# Patient Record
Sex: Female | Born: 1965
Health system: Southern US, Community
[De-identification: ages and names within clinical notes are randomized; demographics above are authoritative.]

## PROBLEM LIST (undated history)

## (undated) DIAGNOSIS — E559 Vitamin D deficiency, unspecified: Secondary | ICD-10-CM

## (undated) DIAGNOSIS — B182 Chronic viral hepatitis C: Secondary | ICD-10-CM

## (undated) DIAGNOSIS — F319 Bipolar disorder, unspecified: Secondary | ICD-10-CM

## (undated) DIAGNOSIS — K819 Cholecystitis, unspecified: Secondary | ICD-10-CM

## (undated) DIAGNOSIS — F1910 Other psychoactive substance abuse, uncomplicated: Secondary | ICD-10-CM

## (undated) DIAGNOSIS — R202 Paresthesia of skin: Secondary | ICD-10-CM

## (undated) DIAGNOSIS — E669 Obesity, unspecified: Secondary | ICD-10-CM

## (undated) DIAGNOSIS — R51 Headache: Secondary | ICD-10-CM

## (undated) DIAGNOSIS — E785 Hyperlipidemia, unspecified: Secondary | ICD-10-CM

## (undated) HISTORY — DX: Vitamin D deficiency, unspecified: E55.9

## (undated) HISTORY — PX: EYE SURGERY: SHX253

## (undated) HISTORY — DX: Headache: R51

## (undated) HISTORY — DX: Obesity, unspecified: E66.9

## (undated) HISTORY — DX: Hyperlipidemia, unspecified: E78.5

## (undated) HISTORY — PX: APPENDECTOMY: SHX54

## (undated) HISTORY — DX: Other psychoactive substance abuse, uncomplicated: F19.10

## (undated) HISTORY — DX: Paresthesia of skin: R20.2

---

## 2009-02-27 ENCOUNTER — Emergency Department (HOSPITAL_COMMUNITY): Admission: EM | Admit: 2009-02-27 | Discharge: 2009-02-27 | Payer: Self-pay | Admitting: Emergency Medicine

## 2009-05-28 ENCOUNTER — Emergency Department (HOSPITAL_COMMUNITY): Admission: EM | Admit: 2009-05-28 | Discharge: 2009-05-29 | Payer: Self-pay | Admitting: Emergency Medicine

## 2009-09-18 ENCOUNTER — Emergency Department (HOSPITAL_COMMUNITY): Admission: EM | Admit: 2009-09-18 | Discharge: 2009-09-18 | Payer: Self-pay | Admitting: Emergency Medicine

## 2010-05-08 ENCOUNTER — Encounter (INDEPENDENT_AMBULATORY_CARE_PROVIDER_SITE_OTHER): Payer: Self-pay | Admitting: *Deleted

## 2010-05-08 ENCOUNTER — Ambulatory Visit: Payer: Self-pay | Admitting: Internal Medicine

## 2010-05-08 LAB — CONVERTED CEMR LAB
ALT: 24 units/L (ref 0–35)
Albumin: 3.9 g/dL (ref 3.5–5.2)
Basophils Absolute: 0 10*3/uL (ref 0.0–0.1)
Basophils Relative: 0 % (ref 0–1)
Calcium: 9.2 mg/dL (ref 8.4–10.5)
Chlamydia, Swab/Urine, PCR: NEGATIVE
Chloride: 106 meq/L (ref 96–112)
Creatinine, Ser: 0.59 mg/dL (ref 0.40–1.20)
Eosinophils Absolute: 0.1 10*3/uL (ref 0.0–0.7)
HCT: 40 % (ref 36.0–46.0)
Hemoglobin: 13 g/dL (ref 12.0–15.0)
Hep A IgM: NEGATIVE
Hep B C IgM: NEGATIVE
Hepatitis B Surface Ag: NEGATIVE
MCV: 90.7 fL (ref 78.0–100.0)
Monocytes Absolute: 0.7 10*3/uL (ref 0.1–1.0)
Neutrophils Relative %: 59 % (ref 43–77)
Platelets: 286 10*3/uL (ref 150–400)
Potassium: 4.5 meq/L (ref 3.5–5.3)

## 2010-05-17 ENCOUNTER — Encounter (INDEPENDENT_AMBULATORY_CARE_PROVIDER_SITE_OTHER): Payer: Self-pay | Admitting: *Deleted

## 2010-10-17 LAB — URINALYSIS, ROUTINE W REFLEX MICROSCOPIC
Bilirubin Urine: NEGATIVE
Ketones, ur: NEGATIVE mg/dL
Nitrite: NEGATIVE
Specific Gravity, Urine: 1.02 (ref 1.005–1.030)
pH: 7 (ref 5.0–8.0)

## 2010-10-17 LAB — URINE MICROSCOPIC-ADD ON

## 2010-12-09 ENCOUNTER — Emergency Department (HOSPITAL_COMMUNITY)
Admission: EM | Admit: 2010-12-09 | Discharge: 2010-12-09 | Disposition: A | Discharge status: Home / Self Care | Payer: Medicare Other | Attending: Emergency Medicine | Admitting: Emergency Medicine

## 2010-12-09 DIAGNOSIS — J40 Bronchitis, not specified as acute or chronic: Secondary | ICD-10-CM | POA: Insufficient documentation

## 2010-12-09 DIAGNOSIS — F17200 Nicotine dependence, unspecified, uncomplicated: Secondary | ICD-10-CM | POA: Insufficient documentation

## 2010-12-09 DIAGNOSIS — R059 Cough, unspecified: Secondary | ICD-10-CM | POA: Insufficient documentation

## 2010-12-09 DIAGNOSIS — R05 Cough: Secondary | ICD-10-CM | POA: Insufficient documentation

## 2010-12-13 ENCOUNTER — Emergency Department (HOSPITAL_COMMUNITY): Payer: Medicare Other

## 2010-12-13 ENCOUNTER — Observation Stay (HOSPITAL_COMMUNITY)
Admission: EM | Admit: 2010-12-13 | Discharge: 2010-12-14 | Disposition: A | Discharge status: Home / Self Care | Payer: Medicare Other | Source: Ambulatory Visit | Attending: Emergency Medicine | Admitting: Emergency Medicine

## 2010-12-13 DIAGNOSIS — R0609 Other forms of dyspnea: Secondary | ICD-10-CM | POA: Insufficient documentation

## 2010-12-13 DIAGNOSIS — F17200 Nicotine dependence, unspecified, uncomplicated: Principal | ICD-10-CM | POA: Insufficient documentation

## 2010-12-13 DIAGNOSIS — R0989 Other specified symptoms and signs involving the circulatory and respiratory systems: Secondary | ICD-10-CM | POA: Insufficient documentation

## 2011-04-27 ENCOUNTER — Emergency Department (HOSPITAL_COMMUNITY)
Admission: EM | Admit: 2011-04-27 | Discharge: 2011-04-28 | Disposition: A | Discharge status: Home / Self Care | Payer: Medicare Other | Attending: Emergency Medicine | Admitting: Emergency Medicine

## 2011-04-27 ENCOUNTER — Emergency Department (HOSPITAL_COMMUNITY): Payer: Medicare Other

## 2011-04-27 DIAGNOSIS — R05 Cough: Secondary | ICD-10-CM | POA: Insufficient documentation

## 2011-04-27 DIAGNOSIS — M79609 Pain in unspecified limb: Secondary | ICD-10-CM | POA: Insufficient documentation

## 2011-04-27 DIAGNOSIS — M25569 Pain in unspecified knee: Secondary | ICD-10-CM | POA: Insufficient documentation

## 2011-04-27 DIAGNOSIS — J069 Acute upper respiratory infection, unspecified: Secondary | ICD-10-CM | POA: Insufficient documentation

## 2011-04-27 DIAGNOSIS — M25559 Pain in unspecified hip: Secondary | ICD-10-CM | POA: Insufficient documentation

## 2011-04-27 DIAGNOSIS — G8929 Other chronic pain: Secondary | ICD-10-CM | POA: Insufficient documentation

## 2011-04-27 DIAGNOSIS — R0789 Other chest pain: Secondary | ICD-10-CM | POA: Insufficient documentation

## 2011-04-27 DIAGNOSIS — R059 Cough, unspecified: Secondary | ICD-10-CM | POA: Insufficient documentation

## 2011-04-27 LAB — POCT I-STAT, CHEM 8
Chloride: 110 mEq/L (ref 96–112)
Potassium: 3.8 mEq/L (ref 3.5–5.1)
Sodium: 143 mEq/L (ref 135–145)

## 2011-04-27 LAB — DIFFERENTIAL
Basophils Absolute: 0 10*3/uL (ref 0.0–0.1)
Basophils Relative: 0 % (ref 0–1)
Lymphocytes Relative: 30 % (ref 12–46)
Lymphs Abs: 2.8 10*3/uL (ref 0.7–4.0)
Monocytes Relative: 10 % (ref 3–12)
Neutrophils Relative %: 58 % (ref 43–77)

## 2011-04-27 LAB — CBC
MCH: 29.5 pg (ref 26.0–34.0)
MCHC: 33.7 g/dL (ref 30.0–36.0)
MCV: 87.6 fL (ref 78.0–100.0)
Platelets: 271 10*3/uL (ref 150–400)
RBC: 3.96 MIL/uL (ref 3.87–5.11)
WBC: 9.3 10*3/uL (ref 4.0–10.5)

## 2011-04-27 LAB — POCT I-STAT TROPONIN I: Troponin i, poc: 0 ng/mL (ref 0.00–0.08)

## 2011-04-27 LAB — D-DIMER, QUANTITATIVE: D-Dimer, Quant: 0.52 ug/mL-FEU — ABNORMAL HIGH (ref 0.00–0.48)

## 2011-04-28 ENCOUNTER — Ambulatory Visit (HOSPITAL_COMMUNITY)
Admission: EM | Admit: 2011-04-28 | Discharge: 2011-04-28 | Disposition: A | Discharge status: Home / Self Care | Payer: Medicare Other | Source: Ambulatory Visit | Attending: Emergency Medicine | Admitting: Emergency Medicine

## 2011-04-28 ENCOUNTER — Ambulatory Visit (HOSPITAL_COMMUNITY): Admission: EM | Admit: 2011-04-28 | Payer: Medicare Other | Source: Ambulatory Visit | Admitting: Emergency Medicine

## 2011-04-28 DIAGNOSIS — M79609 Pain in unspecified limb: Secondary | ICD-10-CM

## 2011-04-28 DIAGNOSIS — R209 Unspecified disturbances of skin sensation: Secondary | ICD-10-CM | POA: Insufficient documentation

## 2011-07-11 ENCOUNTER — Encounter: Payer: Self-pay | Admitting: Emergency Medicine

## 2011-07-11 ENCOUNTER — Emergency Department (HOSPITAL_COMMUNITY)
Admission: EM | Admit: 2011-07-11 | Discharge: 2011-07-11 | Disposition: A | Discharge status: Home / Self Care | Payer: Medicare Other | Attending: Emergency Medicine | Admitting: Emergency Medicine

## 2011-07-11 DIAGNOSIS — F17200 Nicotine dependence, unspecified, uncomplicated: Secondary | ICD-10-CM | POA: Insufficient documentation

## 2011-07-11 DIAGNOSIS — Z8619 Personal history of other infectious and parasitic diseases: Secondary | ICD-10-CM | POA: Insufficient documentation

## 2011-07-11 DIAGNOSIS — M545 Low back pain, unspecified: Secondary | ICD-10-CM | POA: Insufficient documentation

## 2011-07-11 DIAGNOSIS — N390 Urinary tract infection, site not specified: Secondary | ICD-10-CM

## 2011-07-11 HISTORY — DX: Chronic viral hepatitis C: B18.2

## 2011-07-11 LAB — URINALYSIS, ROUTINE W REFLEX MICROSCOPIC
Glucose, UA: NEGATIVE mg/dL
Hgb urine dipstick: NEGATIVE
Nitrite: NEGATIVE
Specific Gravity, Urine: 1.028 (ref 1.005–1.030)
pH: 5 (ref 5.0–8.0)

## 2011-07-11 LAB — URINE MICROSCOPIC-ADD ON

## 2011-07-11 LAB — PREGNANCY, URINE: Preg Test, Ur: NEGATIVE

## 2011-07-11 MED ORDER — IBUPROFEN 800 MG PO TABS
800.0000 mg | ORAL_TABLET | Freq: Once | ORAL | Status: AC
  Administered 2011-07-11: 800 mg via ORAL
  Filled 2011-07-11: qty 1

## 2011-07-11 MED ORDER — CYCLOBENZAPRINE HCL 10 MG PO TABS
10.0000 mg | ORAL_TABLET | Freq: Once | ORAL | Status: AC
  Administered 2011-07-11: 10 mg via ORAL
  Filled 2011-07-11: qty 1

## 2011-07-11 MED ORDER — OXYCODONE-ACETAMINOPHEN 5-325 MG PO TABS
1.0000 | ORAL_TABLET | Freq: Once | ORAL | Status: AC
  Administered 2011-07-11: 1 via ORAL
  Filled 2011-07-11: qty 1

## 2011-07-11 MED ORDER — NITROFURANTOIN MONOHYD MACRO 100 MG PO CAPS: 100.0000 mg | ORAL_CAPSULE | Freq: Two times a day (BID) | ORAL | Status: AC

## 2011-07-11 MED ORDER — ORPHENADRINE CITRATE ER 100 MG PO TB12: 100.0000 mg | ORAL_TABLET | Freq: Two times a day (BID) | ORAL | Status: AC

## 2011-07-11 MED ORDER — NAPROXEN 500 MG PO TABS: 500.0000 mg | ORAL_TABLET | Freq: Two times a day (BID) | ORAL | Status: DC

## 2011-07-11 MED ORDER — OXYCODONE-ACETAMINOPHEN 5-325 MG PO TABS: 1.0000 | ORAL_TABLET | ORAL | Status: AC | PRN

## 2011-07-11 NOTE — ED Notes (Signed)
Pt presents with a c/c of severe mid-lower back since 2200 last night, st's she has chronic back pain but this is much worse than normal, st's "it's hard to breathe."  St's she took Trazodone and xanax  but that didn't help.  St's she drinks a lot of soda, never had kidney stones.

## 2011-07-11 NOTE — ED Provider Notes (Signed)
History     CSN: [id number]  Arrival date & time 07/11/11  0459   First MD Initiated Contact with Patient 07/11/11 0536      Chief Complaint  Patient presents with  . Back Pain    (Consider location/radiation/quality/duration/timing/severity/associated sxs/prior treatment) HPI 45 year old female complains of low back pain which started at 11 PM. Pain is constant and does not radiate. It is described as sharp and severe and she rates the pain at 10 out of 10. Nothing seems to make it better and nothing seems to make it worse. She has not taken any medication for it. She did apply a back brace which did give some slight relief. She denies any weakness, numbness, tingling. She has noted that her urine is darker than normal and states that she has not had a menses for the last 2 months. She did take a home pregnancy test yesterday which was negative. She denies urinary urgency, frequency, tenesmus. Past Medical History  Diagnosis Date  . Hep C w/ coma, chronic     History reviewed. No pertinent past surgical history.  No family history on file.  History  Substance Use Topics  . Smoking status: Current Everyday Smoker -- 1.0 packs/day    Types: Cigarettes  . Smokeless tobacco: Not on file  . Alcohol Use:     OB History    Grav Para Term Preterm Abortions TAB SAB Ect Mult Living                  Review of Systems  Allergies  Review of patient's allergies indicates no known allergies.  Home Medications   Current Outpatient Rx  Name Route Sig Dispense Refill  . ALPRAZOLAM 0.5 MG PO TABS Oral Take 0.5 mg by mouth at bedtime as needed.      Marland Kitchen LAMOTRIGINE 200 MG PO TABS Oral Take 200 mg by mouth daily.     . TRAZODONE HCL 100 MG PO TABS Oral Take 100 mg by mouth at bedtime.        BP 117/54  Pulse 78  Temp(Src) 98.3 F (36.8 C) (Oral)  Resp 18  Ht 5\' 1"  (1.549 m)  Wt 220 lb (99.791 kg)  BMI 41.57 kg/m2  SpO2 98%  LMP 06/17/2011  Physical Exam 45 year old female  who is resting comfortably and in no acute distress. She is obese. Vital signs are normal. Oxygen saturation is 98% which is normal. Head is normocephalic and atraumatic. PERRLA, EOMI. Oropharynx is clear. Neck is supple without adenopathy or JVD. Lungs are clear without rales, wheezes, rhonchi. Back has moderate tenderness in the lumbar spine with moderate left paralumbar spasm and mild right paralumbar spasm. Straight leg raise is positive bilaterally at 30. Heart has regular rate and rhythm without murmur. Abdomen is soft, flat, nontender without masses or hepatosplenomegaly. Extremities have 1+ edema, no cyanosis. Full range of motion is present. Skin is warm and moist without rash. Neurologic: Mental status is normal, cranial nerves are intact, there no focal motor or sensory deficits. Detailed motor and sensory examination was done of the legs and was completely normal. ED Course  Procedures (including critical care time)   Labs Reviewed  URINALYSIS, ROUTINE W REFLEX MICROSCOPIC  PREGNANCY, URINE   No results found.  Results for orders placed during the hospital encounter of 07/11/11  URINALYSIS, ROUTINE W REFLEX MICROSCOPIC      Component Value Range   Color, Urine AMBER (*) YELLOW    APPearance CLOUDY (*) CLEAR  Specific Gravity, Urine 1.028  1.005 - 1.030    pH 5.0  5.0 - 8.0    Glucose, UA NEGATIVE  NEGATIVE (mg/dL)   Hgb urine dipstick NEGATIVE  NEGATIVE    Bilirubin Urine NEGATIVE  NEGATIVE    Ketones, ur NEGATIVE  NEGATIVE (mg/dL)   Protein, ur NEGATIVE  NEGATIVE (mg/dL)   Urobilinogen, UA 1.0  0.0 - 1.0 (mg/dL)   Nitrite NEGATIVE  NEGATIVE    Leukocytes, UA SMALL (*) NEGATIVE   PREGNANCY, URINE      Component Value Range   Preg Test, Ur NEGATIVE    URINE MICROSCOPIC-ADD ON      Component Value Range   Squamous Epithelial / LPF MANY (*) RARE    WBC, UA 21-50  <3 (WBC/hpf)   RBC / HPF 3-6  <3 (RBC/hpf)   Bacteria, UA MANY (*) RARE    Urine-Other MUCOUS PRESENT      No results found.   No diagnosis found.  She was given a dose of Percocet, Flexeril, and ibuprofen with excellent relief of pain. Urinalysis does show WBC clumps, but it is a contaminated specimen. I would treat her in. The for possible UTI, but I am not 100% certain that she actually has one. She will be sent home with prescriptions for Percocet, Norflex, naproxen, and Macrobid.  MDM  Musculoskeletal back pain with no evidence of radiculopathy.        Dione Booze, MD 07/11/11 [phone]

## 2011-07-11 NOTE — ED Notes (Signed)
Pt alert, nad, ambulates to triage, arrives via EMS, c/o low back pain, onset was a week ago, resp even unlabored, skin pwd, denies trauma or injury

## 2011-07-11 NOTE — ED Notes (Signed)
Patient verbalized understanding  

## 2011-07-24 ENCOUNTER — Encounter (HOSPITAL_COMMUNITY): Payer: Self-pay | Admitting: Emergency Medicine

## 2011-07-24 ENCOUNTER — Emergency Department (HOSPITAL_COMMUNITY): Payer: Medicare Other

## 2011-07-24 ENCOUNTER — Emergency Department (HOSPITAL_COMMUNITY)
Admission: EM | Admit: 2011-07-24 | Discharge: 2011-07-25 | Disposition: A | Discharge status: Home / Self Care | Payer: Medicare Other | Attending: Emergency Medicine | Admitting: Emergency Medicine

## 2011-07-24 DIAGNOSIS — Z8619 Personal history of other infectious and parasitic diseases: Secondary | ICD-10-CM | POA: Diagnosis not present

## 2011-07-24 DIAGNOSIS — L02519 Cutaneous abscess of unspecified hand: Secondary | ICD-10-CM | POA: Insufficient documentation

## 2011-07-24 DIAGNOSIS — Z79899 Other long term (current) drug therapy: Secondary | ICD-10-CM | POA: Diagnosis not present

## 2011-07-24 DIAGNOSIS — M79609 Pain in unspecified limb: Secondary | ICD-10-CM | POA: Diagnosis not present

## 2011-07-24 DIAGNOSIS — L02511 Cutaneous abscess of right hand: Secondary | ICD-10-CM

## 2011-07-24 DIAGNOSIS — F319 Bipolar disorder, unspecified: Secondary | ICD-10-CM | POA: Diagnosis not present

## 2011-07-24 DIAGNOSIS — L03019 Cellulitis of unspecified finger: Secondary | ICD-10-CM | POA: Insufficient documentation

## 2011-07-24 HISTORY — DX: Bipolar disorder, unspecified: F31.9

## 2011-07-24 LAB — POCT PREGNANCY, URINE: Preg Test, Ur: NEGATIVE

## 2011-07-24 MED ORDER — CLINDAMYCIN HCL 300 MG PO CAPS
300.0000 mg | ORAL_CAPSULE | Freq: Once | ORAL | Status: AC
  Administered 2011-07-24: 300 mg via ORAL
  Filled 2011-07-24 (×2): qty 1

## 2011-07-24 NOTE — ED Notes (Signed)
Pt c/o infection to nail bed to right middle finger, onset 4 days ago.  Pt also wants to talk to a Child psychotherapist for her Trazodone and another med.  St's she doesn't have the money to get her Rx filled

## 2011-07-24 NOTE — ED Provider Notes (Signed)
Pt seen with resident She has full ROM of finger, no tenderness along flexor sheath May be amenable to drainage and outpatient antibiotics  Joya Gaskins, MD 07/24/11 2242

## 2011-07-25 MED ORDER — CLINDAMYCIN HCL 150 MG PO CAPS: 300.0000 mg | ORAL_CAPSULE | Freq: Four times a day (QID) | ORAL | Status: AC

## 2011-07-25 NOTE — ED Notes (Signed)
Pt ambulated with steady gait; VSS; no signs of distress; A&Ox3; finger bandaged; no questions or concerns.

## 2011-07-25 NOTE — ED Provider Notes (Signed)
I have personally seen and examined the patient.  I have discussed the plan of care with the resident.  I have reviewed the documentation on PMH/FH/Soc. History.  I have reviewed the documentation of the resident and agree.   I was present for key portions of procedure.    Joya Gaskins, MD 07/25/11 [phone]

## 2011-07-25 NOTE — ED Provider Notes (Signed)
History     CSN: [id number]  Arrival date & time 07/24/11  1851   First MD Initiated Contact with Patient 07/24/11 2114      Chief Complaint  Patient presents with  . Finger Injury    (Consider location/radiation/quality/duration/timing/severity/associated sxs/prior treatment) HPI history from patient Patient is a 46 year old female presents with right middle finger injury. She injured her finger while making a bed about one week ago. Since that time she's had persistent pain of that finger as well  is as new purulent drainage. The pain has been constant progressively worsening. It is worse when she presses on the injured area. She's not had any significant streaking redness. She has had no fever or other systemic symptoms. Overall severity moderate.   Past Medical History  Diagnosis Date  . Hep C w/ coma, chronic   . Bipolar 1 disorder     History reviewed. No pertinent past surgical history.  No family history on file.  History  Substance Use Topics  . Smoking status: Current Everyday Smoker -- 1.0 packs/day    Types: Cigarettes  . Smokeless tobacco: Not on file  . Alcohol Use: No    OB History    Grav Para Term Preterm Abortions TAB SAB Ect Mult Living                  Review of Systems  Constitutional: Negative for fever and chills.  HENT: Negative for facial swelling.   Eyes: Negative for visual disturbance.  Respiratory: Negative for cough, chest tightness, shortness of breath and wheezing.   Cardiovascular: Negative for chest pain.  Gastrointestinal: Negative for nausea, vomiting, abdominal pain and diarrhea.  Genitourinary: Negative for difficulty urinating.  Skin: Negative for rash.  Neurological: Negative for weakness and numbness.  Psychiatric/Behavioral: Negative for behavioral problems and confusion.  All other systems reviewed and are negative.    Allergies  Review of patient's allergies indicates no known allergies.  Home Medications    Current Outpatient Rx  Name Route Sig Dispense Refill  . ALPRAZOLAM 0.5 MG PO TABS Oral Take 0.5 mg by mouth at bedtime as needed. For anxiety    . LAMOTRIGINE 200 MG PO TABS Oral Take 200 mg by mouth daily.     Marland Kitchen NAPROXEN 500 MG PO TABS Oral Take 500 mg by mouth 2 (two) times daily.    Marland Kitchen OVER THE COUNTER MEDICATION Oral Take 1 tablet by mouth daily. [employer] Herbal supplement    . TRAZODONE HCL 100 MG PO TABS Oral Take 100 mg by mouth at bedtime.      Marland Kitchen CLINDAMYCIN HCL 150 MG PO CAPS Oral Take 2 capsules (300 mg total) by mouth 4 (four) times daily. 56 capsule 0    BP 118/69  Pulse 81  Temp(Src) 98.4 F (36.9 C) (Oral)  Resp 20  SpO2 96%  LMP 06/17/2011  Physical Exam  Nursing note and vitals reviewed. Constitutional: She is oriented to person, place, and time. She appears well-developed and well-nourished. No distress.  HENT:  Head: Normocephalic and atraumatic.  Nose: Nose normal.  Eyes: EOM are normal.  Neck: Normal range of motion. No JVD present.  Cardiovascular: Normal rate and intact distal pulses.   Pulmonary/Chest: Effort normal. No respiratory distress.  Abdominal: Soft.       No visible injury  Musculoskeletal: Normal range of motion.       No pain with range of motion and no visible injury  Right middle finger: In distal third of finger  there is small superficial abscesses with mild surrounding erythema. Active drainage of purulent material. No elevation of nail. No bony point tenderness. Range of motion is intact. There is no extension of erythema along the flexor tendon sheath. No tenderness palpation over flexor tendon sheath. Patient has full range of motion of all joints of finger and hand. Normal sensation and capillary refill.   Neurological: She is alert and oriented to person, place, and time. No cranial nerve deficit. Coordination normal.  Skin: Skin is warm and dry. She is not diaphoretic.  Psychiatric: She has a normal mood and affect. Her behavior is  normal. Thought content normal.    ED Course  Procedures (including critical care time)  INCISION AND DRAINAGE Performed by: Milus Glazier Consent: Verbal consent obtained. Risks and benefits: risks, benefits and alternatives were discussed Type: abscess  Body area: Right middle finger  Anesthesia: Digital block  Local anesthetic: lidocaine 1% without epinephrine  Anesthetic total: 5 ml  Complexity: complex  Abscesses superficial. It was unroofed and drainage expressed. Underlying area was irrigated extensively with sterile water. It was then scrubbed with Betadine sponge and debrided. It was then irrigated again. Over a liter of irrigation for wound. There was well vascularized bed after debridement.  Drainage: purulent  Drainage amount: Mild   No packing, dressed with Xeroform and gauze   Patient tolerance: Patient tolerated the procedure well with no immediate complications.     Labs Reviewed  POCT PREGNANCY, URINE  POCT PREGNANCY, URINE   Dg Finger Middle Right  07/24/2011  *RADIOLOGY REPORT*  Clinical Data: Pain and swelling  RIGHT MIDDLE FINGER 2+V  Comparison: None.  Findings: No evidence of fracture or dislocation of the third digit.  No soft tissue abnormality.  No foreign body.  IMPRESSION: No osseous abnormality.  Original Report Authenticated By: Genevive Bi, M.D.     1. Abscess of finger of right hand       MDM     Clinical picture is consistent with superficial abscess of finger. There is no overlying vesicles or evidence of herpetic whitlow. Also no evidence of flexor tenosynovitis. X-rays negative. Abscess I&D and underlying tissue debrided. Will prescribe antibiotics and have her follow up with hand. Patient is well appearing and stable for outpatient management.      Milus Glazier 07/25/11 0038

## 2011-11-16 ENCOUNTER — Emergency Department (HOSPITAL_COMMUNITY)
Admission: EM | Admit: 2011-11-16 | Discharge: 2011-11-16 | Disposition: A | Discharge status: Home / Self Care | Payer: Medicare Other | Attending: Emergency Medicine | Admitting: Emergency Medicine

## 2011-11-16 ENCOUNTER — Encounter (HOSPITAL_COMMUNITY): Payer: Self-pay | Admitting: Emergency Medicine

## 2011-11-16 DIAGNOSIS — Z8619 Personal history of other infectious and parasitic diseases: Secondary | ICD-10-CM | POA: Insufficient documentation

## 2011-11-16 DIAGNOSIS — F17200 Nicotine dependence, unspecified, uncomplicated: Secondary | ICD-10-CM | POA: Diagnosis not present

## 2011-11-16 DIAGNOSIS — N342 Other urethritis: Secondary | ICD-10-CM | POA: Diagnosis not present

## 2011-11-16 LAB — WET PREP, GENITAL
WBC, Wet Prep HPF POC: NONE SEEN
Yeast Wet Prep HPF POC: NONE SEEN

## 2011-11-16 LAB — RPR: RPR Ser Ql: NONREACTIVE

## 2011-11-16 LAB — URINALYSIS, ROUTINE W REFLEX MICROSCOPIC
Ketones, ur: NEGATIVE mg/dL
Leukocytes, UA: NEGATIVE
Nitrite: NEGATIVE
Specific Gravity, Urine: 1.023 (ref 1.005–1.030)
pH: 6.5 (ref 5.0–8.0)

## 2011-11-16 LAB — URINE MICROSCOPIC-ADD ON

## 2011-11-16 LAB — PREGNANCY, URINE: Preg Test, Ur: NEGATIVE

## 2011-11-16 MED ORDER — CEFTRIAXONE SODIUM 250 MG IJ SOLR
250.0000 mg | Freq: Once | INTRAMUSCULAR | Status: AC
  Administered 2011-11-16: 250 mg via INTRAMUSCULAR
  Filled 2011-11-16: qty 250

## 2011-11-16 MED ORDER — AZITHROMYCIN 250 MG PO TABS
1000.0000 mg | ORAL_TABLET | Freq: Once | ORAL | Status: AC
  Administered 2011-11-16: 1000 mg via ORAL
  Filled 2011-11-16: qty 4

## 2011-11-16 MED ORDER — METRONIDAZOLE 500 MG PO TABS
2000.0000 mg | ORAL_TABLET | Freq: Once | ORAL | Status: AC
  Administered 2011-11-16: 2000 mg via ORAL
  Filled 2011-11-16: qty 4

## 2011-11-16 MED ORDER — LIDOCAINE HCL (PF) 1 % IJ SOLN
INTRAMUSCULAR | Status: AC
  Administered 2011-11-16: 14:00:00
  Filled 2011-11-16: qty 5

## 2011-11-16 NOTE — Discharge Instructions (Signed)
Followup with the [facility]. Return here as needed from worsening in her condition.  I would avoid sexual contact until her symptoms have resolved.

## 2011-11-16 NOTE — ED Provider Notes (Signed)
History     CSN: [id number]  Arrival date & time 11/16/11  1033   First MD Initiated Contact with Patient 11/16/11 1123      Chief Complaint  Patient presents with  . Urinary Tract Infection    (Consider location/radiation/quality/duration/timing/severity/associated sxs/prior treatment) HPI Patient presents to the emergency department with burning with urination for the last 4 days.  She states, that she has 2 sexual partners.  She states one is newer and the condom broke.  States, that she would like to be checked for sexually transmitted diseases.  Patient denies fever, abdominal pain, nausea/vomiting, diarrhea, back pain, weakness, chest pain, shortness of breath, headache, visual changes, or dizziness.  Patient, states, that she has not taken any medications to alleviate her symptoms.  The only thing to make her symptoms worse is urination. Past Medical History  Diagnosis Date  . Hep C w/ coma, chronic   . Bipolar 1 disorder     Past Surgical History  Procedure Date  . Appendectomy   . Eye surgery     History reviewed. No pertinent family history.  History  Substance Use Topics  . Smoking status: Current Everyday Smoker -- 1.0 packs/day    Types: Cigarettes  . Smokeless tobacco: Not on file  . Alcohol Use: No    OB History    Grav Para Term Preterm Abortions TAB SAB Ect Mult Living                  Review of Systems All other systems negative except as documented in the HPI. All pertinent positives and negatives as reviewed in the HPI.  Allergies  Review of patient's allergies indicates no known allergies.  Home Medications   Current Outpatient Rx  Name Route Sig Dispense Refill  . ALPRAZOLAM 1 MG PO TABS Oral Take 1 mg by mouth 2 (two) times daily as needed. For anxiety    . LAMOTRIGINE 200 MG PO TABS Oral Take 200 mg by mouth at bedtime.     . ADULT MULTIVITAMIN W/MINERALS CH Oral Take 1 tablet by mouth daily.    Marland Kitchen OVER THE COUNTER MEDICATION Oral Take 1  tablet by mouth daily. caltrate    . OVER THE COUNTER MEDICATION Oral Take 1 tablet by mouth 2 (two) times daily. [employer]- Oxy elite pro    . SINUS PO Oral Take 1 tablet by mouth every 12 (twelve) hours as needed. For congestion    . VITAMIN E PO Oral Take 1 capsule by mouth daily.      BP 118/62  Pulse 77  Temp(Src) 97.6 F (36.4 C) (Oral)  Resp 20  SpO2 97%  LMP 11/16/2011  Physical Exam Physical Examination: General appearance - alert, well appearing, and in no distress, oriented to person, place, and time and overweight Mental status - alert, oriented to person, place, and time, normal mood, behavior, speech, dress, motor activity, and thought processes Ears - bilateral TM's and external ear canals normal Nose - normal and patent, no erythema, discharge or polyps Mouth - mucous membranes moist, pharynx normal without lesions Chest - clear to auscultation, no wheezes, rales or rhonchi, symmetric air entry, no tachypnea, retractions or cyanosis Heart - normal rate, regular rhythm, normal S1, S2, no murmurs, rubs, clicks or gallops Abdomen - soft, nontender, nondistended, no masses or organomegaly Pelvic - VULVA: normal appearing vulva with no masses, tenderness or lesions, VAGINA: PELVIC FLOOR EXAM: no cystocele, rectocele or prolapse noted, vaginal tenderness not of the cervix, CERVIX:  cervical discharge present - creamy and bloody, WET MOUNT done - results: clue cells, trichomonads, cervical motion tenderness present, ADNEXA: normal adnexa in size, nontender and no masses, WET MOUNT done - results: clue cells, trichomonads, exam chaperoned by RN Brooks Sailors Skin - normal coloration and turgor, no rashes, no suspicious skin lesions noted  ED Course  Procedures (including critical care time)  Labs Reviewed  URINALYSIS, ROUTINE W REFLEX MICROSCOPIC - Abnormal; Notable for the following:    Hgb urine dipstick SMALL (*)    All other components within normal limits  URINE  MICROSCOPIC-ADD ON - Abnormal; Notable for the following:    Squamous Epithelial / LPF FEW (*)    All other components within normal limits  PREGNANCY, URINE  GC/CHLAMYDIA PROBE AMP, GENITAL  RPR    The patient will be treated for presumptive STDs, based on her exam and the lack of evidence that there is another infection causing her problem.  She still return here for any worsening in her condition.  I also advised her to follow up with the health department as they can follow her for these issues as well and do HIV testing.  Patient is advised that high risk behaviors can lead to more significant STDs.  MDM          Carlyle Dolly, PA-C 11/16/11 1302

## 2011-11-16 NOTE — ED Provider Notes (Signed)
Medical screening examination/treatment/procedure(s) were performed by non-physician practitioner and as supervising physician I was immediately available for consultation/collaboration.   Tonya Carlile A Dariann Huckaba, MD 11/16/11 1955 

## 2011-11-16 NOTE — ED Notes (Addendum)
Pt presents with c/o burning with urination. Pt reports symptoms began with soreness to clitoris area. Pt had recent sexual intercourse and the condom busted. Pt has 2 sexual partners but she only uses condoms with one. Pt request to be checked for STD.

## 2011-11-19 LAB — GC/CHLAMYDIA PROBE AMP, GENITAL
Chlamydia, DNA Probe: UNDETERMINED
GC Probe Amp, Genital: NEGATIVE

## 2012-05-09 ENCOUNTER — Encounter (HOSPITAL_COMMUNITY): Payer: Self-pay | Admitting: *Deleted

## 2012-05-09 ENCOUNTER — Emergency Department (HOSPITAL_COMMUNITY)
Admission: EM | Admit: 2012-05-09 | Discharge: 2012-05-10 | Disposition: A | Discharge status: Home / Self Care | Payer: Medicare Other | Attending: Emergency Medicine | Admitting: Emergency Medicine

## 2012-05-09 DIAGNOSIS — F17200 Nicotine dependence, unspecified, uncomplicated: Secondary | ICD-10-CM | POA: Diagnosis not present

## 2012-05-09 DIAGNOSIS — K8000 Calculus of gallbladder with acute cholecystitis without obstruction: Secondary | ICD-10-CM | POA: Diagnosis not present

## 2012-05-09 DIAGNOSIS — F319 Bipolar disorder, unspecified: Secondary | ICD-10-CM | POA: Diagnosis not present

## 2012-05-09 DIAGNOSIS — B182 Chronic viral hepatitis C: Secondary | ICD-10-CM | POA: Insufficient documentation

## 2012-05-09 DIAGNOSIS — K8020 Calculus of gallbladder without cholecystitis without obstruction: Secondary | ICD-10-CM

## 2012-05-09 NOTE — ED Notes (Signed)
Pt states right side pain with nausea

## 2012-05-10 ENCOUNTER — Emergency Department (HOSPITAL_COMMUNITY): Payer: Medicare Other

## 2012-05-10 LAB — URINE MICROSCOPIC-ADD ON

## 2012-05-10 LAB — CBC
HCT: 40 % (ref 36.0–46.0)
MCHC: 34 g/dL (ref 30.0–36.0)
MCV: 89.5 fL (ref 78.0–100.0)
RDW: 13.9 % (ref 11.5–15.5)

## 2012-05-10 LAB — COMPREHENSIVE METABOLIC PANEL
ALT: 11 U/L (ref 0–35)
AST: 20 U/L (ref 0–37)
Albumin: 3.3 g/dL — ABNORMAL LOW (ref 3.5–5.2)
Calcium: 9.4 mg/dL (ref 8.4–10.5)
Chloride: 102 mEq/L (ref 96–112)
Creatinine, Ser: 0.7 mg/dL (ref 0.50–1.10)
Sodium: 137 mEq/L (ref 135–145)
Total Bilirubin: 0.2 mg/dL — ABNORMAL LOW (ref 0.3–1.2)

## 2012-05-10 LAB — URINALYSIS, ROUTINE W REFLEX MICROSCOPIC
Bilirubin Urine: NEGATIVE
Hgb urine dipstick: NEGATIVE
Protein, ur: NEGATIVE mg/dL
Urobilinogen, UA: 0.2 mg/dL (ref 0.0–1.0)

## 2012-05-10 LAB — PREGNANCY, URINE: Preg Test, Ur: NEGATIVE

## 2012-05-10 MED ORDER — SODIUM CHLORIDE 0.9 % IV SOLN
INTRAVENOUS | Status: DC
  Administered 2012-05-10: 03:00:00 via INTRAVENOUS

## 2012-05-10 MED ORDER — HYDROMORPHONE HCL PF 1 MG/ML IJ SOLN
1.0000 mg | Freq: Once | INTRAMUSCULAR | Status: AC
  Administered 2012-05-10: 1 mg via INTRAVENOUS
  Filled 2012-05-10: qty 1

## 2012-05-10 MED ORDER — PANTOPRAZOLE SODIUM 40 MG IV SOLR
40.0000 mg | Freq: Once | INTRAVENOUS | Status: AC
  Administered 2012-05-10: 40 mg via INTRAVENOUS
  Filled 2012-05-10: qty 40

## 2012-05-10 MED ORDER — ONDANSETRON HCL 4 MG/2ML IJ SOLN
4.0000 mg | Freq: Once | INTRAMUSCULAR | Status: AC
  Administered 2012-05-10: 4 mg via INTRAVENOUS
  Filled 2012-05-10: qty 2

## 2012-05-10 MED ORDER — ONDANSETRON HCL 4 MG PO TABS: 4.0000 mg | ORAL_TABLET | Freq: Four times a day (QID) | ORAL | Status: DC

## 2012-05-10 MED ORDER — OXYCODONE-ACETAMINOPHEN 5-325 MG PO TABS: 2.0000 | ORAL_TABLET | ORAL | Status: DC | PRN

## 2012-05-10 NOTE — ED Provider Notes (Signed)
History     CSN: [id number]  Arrival date & time 05/09/12  2238   First MD Initiated Contact with Patient 05/10/12 0131      Chief Complaint  Patient presents with  . Flank Pain    (Consider location/radiation/quality/duration/timing/severity/associated sxs/prior treatment) HPI   HX per PT, cooking at home, eating hot dogs, developed RUQ pain sharp in quality, no radiation of pain, no F/C, some nausea, no vomiting. No diarrhea, no rash or trauma. No known agg or alleviating factors.onset 11:30pm waxing and waning   Past Medical History  Diagnosis Date  . Hep C w/ coma, chronic   . Bipolar 1 disorder     Past Surgical History  Procedure Date  . Appendectomy   . Eye surgery     History reviewed. No pertinent family history.  History  Substance Use Topics  . Smoking status: Current Every Day Smoker -- 1.0 packs/day    Types: Cigarettes  . Smokeless tobacco: Not on file  . Alcohol Use: No    OB History    Grav Para Term Preterm Abortions TAB SAB Ect Mult Living                  Review of Systems  Constitutional: Negative for fever and chills.  HENT: Negative for neck pain and neck stiffness.   Eyes: Negative for pain.  Respiratory: Negative for shortness of breath.   Cardiovascular: Negative for chest pain.  Gastrointestinal: Positive for abdominal pain. Negative for blood in stool.  Genitourinary: Negative for dysuria.  Musculoskeletal: Negative for back pain.  Skin: Negative for rash.  Neurological: Negative for headaches.  All other systems reviewed and are negative.    Allergies  Review of patient's allergies indicates no known allergies.  Home Medications   Current Outpatient Rx  Name Route Sig Dispense Refill  . HYDROXYZINE HCL 25 MG PO TABS Oral Take 25 mg by mouth 3 (three) times daily as needed. For anxiety    . LAMOTRIGINE 200 MG PO TABS Oral Take 200 mg by mouth at bedtime.     . TRAZODONE HCL 100 MG PO TABS Oral Take 100 mg by mouth at  bedtime.      BP 105/33  Pulse 76  Temp 98.3 F (36.8 C) (Oral)  Resp 24  Ht 5\' 1"  (1.549 m)  Wt 207 lb (93.895 kg)  BMI 39.11 kg/m2  SpO2 96%  LMP 03/17/2012  Physical Exam  Constitutional: She is oriented to person, place, and time. She appears well-developed and well-nourished.  HENT:  Head: Normocephalic and atraumatic.  Eyes: EOM are normal. Pupils are equal, round, and reactive to light. No scleral icterus.  Neck: Neck supple.  Cardiovascular: Regular rhythm and intact distal pulses.   Pulmonary/Chest: Effort normal. No respiratory distress.  Abdominal: Bowel sounds are normal. She exhibits no distension and no mass. There is no rebound and no guarding.       TTP epigastric and RUQ, neg Murphys sign  Musculoskeletal: Normal range of motion. She exhibits no edema.  Neurological: She is alert and oriented to person, place, and time.  Skin: Skin is warm and dry.    ED Course  Procedures (including critical care time)  Results for orders placed during the hospital encounter of 05/09/12  URINALYSIS, ROUTINE W REFLEX MICROSCOPIC      Component Value Range   Color, Urine YELLOW  YELLOW   APPearance CLOUDY (*) CLEAR   Specific Gravity, Urine 1.020  1.005 - 1.030  pH 5.5  5.0 - 8.0   Glucose, UA NEGATIVE  NEGATIVE mg/dL   Hgb urine dipstick NEGATIVE  NEGATIVE   Bilirubin Urine NEGATIVE  NEGATIVE   Ketones, ur NEGATIVE  NEGATIVE mg/dL   Protein, ur NEGATIVE  NEGATIVE mg/dL   Urobilinogen, UA 0.2  0.0 - 1.0 mg/dL   Nitrite NEGATIVE  NEGATIVE   Leukocytes, UA SMALL (*) NEGATIVE  PREGNANCY, URINE      Component Value Range   Preg Test, Ur NEGATIVE  NEGATIVE  CBC      Component Value Range   WBC 8.8  4.0 - 10.5 K/uL   RBC 4.47  3.87 - 5.11 MIL/uL   Hemoglobin 13.6  12.0 - 15.0 g/dL   HCT 40.9  81.1 - 91.4 %   MCV 89.5  78.0 - 100.0 fL   MCH 30.4  26.0 - 34.0 pg   MCHC 34.0  30.0 - 36.0 g/dL   RDW 78.2  95.6 - 21.3 %   Platelets 266  150 - 400 K/uL  COMPREHENSIVE  METABOLIC PANEL      Component Value Range   Sodium 137  135 - 145 mEq/L   Potassium 4.2  3.5 - 5.1 mEq/L   Chloride 102  96 - 112 mEq/L   CO2 26  19 - 32 mEq/L   Glucose, Bld 99  70 - 99 mg/dL   BUN 12  6 - 23 mg/dL   Creatinine, Ser 0.86  0.50 - 1.10 mg/dL   Calcium 9.4  8.4 - 57.8 mg/dL   Total Protein 7.5  6.0 - 8.3 g/dL   Albumin 3.3 (*) 3.5 - 5.2 g/dL   AST 20  0 - 37 U/L   ALT 11  0 - 35 U/L   Alkaline Phosphatase 107  39 - 117 U/L   Total Bilirubin 0.2 (*) 0.3 - 1.2 mg/dL   GFR calc non Af Amer >90  >90 mL/min   GFR calc Af Amer >90  >90 mL/min  LIPASE, BLOOD      Component Value Range   Lipase 22  11 - 59 U/L  URINE MICROSCOPIC-ADD ON      Component Value Range   Squamous Epithelial / LPF FEW (*) RARE   WBC, UA 3-6  <3 WBC/hpf   Bacteria, UA RARE  RARE   US Abdomen Limited  05/10/2012  *RADIOLOGY REPORT*  Clinical Data:  Right upper quadrant pain.  History of hepatitis C.  LIMITED ABDOMINAL ULTRASOUND - RIGHT UPPER QUADRANT  Comparison:  None.  Findings:  Gallbladder:  9 mm stone in the gallbladder neck.  No gallbladder distension, wall thickening, edema, or sludge.  Murphy's sign is reportedly positive.  Common bile duct:  Normal caliber with measured diameter of 5 mm.  Liver:  Mild increased hepatic echotexture diffusely which may correlate with the patient's history of hepatitis C or may be due to fatty infiltration.  IMPRESSION: 9 mm stone in the gallbladder neck with positive Murphy's sign. Changes are nonspecific but could be associated with acute cholecystitis in the appropriate clinical setting.                    Original Report Authenticated By: Marlon Pel, M.D.   IVFs. IV narcotics. IV zofran. NPO. [place] ABD.   7:33 AM PT evaluated pain improved. Discussed as above with Dr Carolynne Edouard, recs follow up tomorrow in Urgent Clinic to schedule GB removal. PT agreeable to plan, no emesis. RX pain meds, zofran and  return precautions verbalized as understood.  MDM   ABD  pain with GB stone on [place]. Labs reviewed WNL - [facility] consult. Plan d/c home and close follow up.          Sunnie Nielsen, MD 05/10/12 [phone]

## 2012-05-10 NOTE — ED Notes (Signed)
RN request to obtain labs with the start of IV 

## 2012-05-10 NOTE — ED Notes (Signed)
Pt presents to ED with Rt flank pain. States pain onset 2130, yesterday. Denies N/V. States she was cooking when sudden sharp pain onset.

## 2012-05-10 NOTE — ED Notes (Signed)
Patient  Ultrasound in progress. 

## 2012-05-12 ENCOUNTER — Encounter (INDEPENDENT_AMBULATORY_CARE_PROVIDER_SITE_OTHER): Payer: Self-pay | Admitting: Surgery

## 2012-05-12 ENCOUNTER — Encounter (HOSPITAL_COMMUNITY): Payer: Self-pay | Admitting: General Practice

## 2012-05-12 ENCOUNTER — Encounter (HOSPITAL_COMMUNITY): Admission: AD | Disposition: A | Discharge status: Home / Self Care | Payer: Self-pay | Source: Ambulatory Visit

## 2012-05-12 ENCOUNTER — Observation Stay (HOSPITAL_COMMUNITY): Payer: Medicare Other | Admitting: Certified Registered"

## 2012-05-12 ENCOUNTER — Encounter (HOSPITAL_COMMUNITY): Payer: Self-pay | Admitting: Certified Registered"

## 2012-05-12 ENCOUNTER — Observation Stay (HOSPITAL_COMMUNITY)
Admission: AD | Admit: 2012-05-12 | Discharge: 2012-05-13 | Disposition: A | Discharge status: Home / Self Care | Payer: Medicare Other | Source: Ambulatory Visit | Attending: General Surgery | Admitting: General Surgery

## 2012-05-12 ENCOUNTER — Ambulatory Visit (INDEPENDENT_AMBULATORY_CARE_PROVIDER_SITE_OTHER): Payer: Medicare Other | Admitting: Surgery

## 2012-05-12 ENCOUNTER — Observation Stay (HOSPITAL_COMMUNITY): Payer: Medicare Other

## 2012-05-12 VITALS — BP 102/72 | HR 77 | Temp 97.5°F | Ht 61.5 in | Wt 206.2 lb

## 2012-05-12 DIAGNOSIS — Z23 Encounter for immunization: Secondary | ICD-10-CM | POA: Insufficient documentation

## 2012-05-12 DIAGNOSIS — K819 Cholecystitis, unspecified: Secondary | ICD-10-CM

## 2012-05-12 DIAGNOSIS — K811 Chronic cholecystitis: Principal | ICD-10-CM | POA: Insufficient documentation

## 2012-05-12 DIAGNOSIS — K8010 Calculus of gallbladder with chronic cholecystitis without obstruction: Secondary | ICD-10-CM

## 2012-05-12 DIAGNOSIS — K810 Acute cholecystitis: Secondary | ICD-10-CM

## 2012-05-12 HISTORY — DX: Cholecystitis, unspecified: K81.9

## 2012-05-12 HISTORY — PX: CHOLECYSTECTOMY: SHX55

## 2012-05-12 LAB — APTT: aPTT: 29 seconds (ref 24–37)

## 2012-05-12 LAB — CBC WITH DIFFERENTIAL/PLATELET
Hemoglobin: 13 g/dL (ref 12.0–15.0)
Lymphs Abs: 2.6 10*3/uL (ref 0.7–4.0)
Monocytes Relative: 7 % (ref 3–12)
Neutro Abs: 5.3 10*3/uL (ref 1.7–7.7)
Neutrophils Relative %: 61 % (ref 43–77)
Platelets: 231 10*3/uL (ref 150–400)
RBC: 4.36 MIL/uL (ref 3.87–5.11)
WBC: 8.7 10*3/uL (ref 4.0–10.5)

## 2012-05-12 LAB — COMPREHENSIVE METABOLIC PANEL
ALT: 11 U/L (ref 0–35)
Albumin: 3.4 g/dL — ABNORMAL LOW (ref 3.5–5.2)
Alkaline Phosphatase: 98 U/L (ref 39–117)
BUN: 13 mg/dL (ref 6–23)
Chloride: 102 mEq/L (ref 96–112)
GFR calc Af Amer: 86 mL/min — ABNORMAL LOW (ref >90)
Glucose, Bld: 87 mg/dL (ref 70–99)
Potassium: 4.3 mEq/L (ref 3.5–5.1)
Sodium: 137 mEq/L (ref 135–145)
Total Bilirubin: 0.4 mg/dL (ref 0.3–1.2)
Total Protein: 7.7 g/dL (ref 6.0–8.3)

## 2012-05-12 LAB — SURGICAL PCR SCREEN: Staphylococcus aureus: NEGATIVE

## 2012-05-12 LAB — LIPASE, BLOOD: Lipase: 21 U/L (ref 11–59)

## 2012-05-12 LAB — PROTIME-INR: Prothrombin Time: 12.6 seconds (ref 11.6–15.2)

## 2012-05-12 SURGERY — LAPAROSCOPIC CHOLECYSTECTOMY WITH INTRAOPERATIVE CHOLANGIOGRAM
Anesthesia: General | Site: Abdomen | Wound class: Clean Contaminated

## 2012-05-12 MED ORDER — SODIUM CHLORIDE 0.9 % IV SOLN
INTRAVENOUS | Status: DC | PRN
  Administered 2012-05-12: 21:00:00

## 2012-05-12 MED ORDER — NEOSTIGMINE METHYLSULFATE 1 MG/ML IJ SOLN
INTRAMUSCULAR | Status: DC | PRN
  Administered 2012-05-12: 5 mg via INTRAVENOUS

## 2012-05-12 MED ORDER — ONDANSETRON HCL 4 MG/2ML IJ SOLN
INTRAMUSCULAR | Status: DC | PRN
  Administered 2012-05-12: 4 mg via INTRAVENOUS

## 2012-05-12 MED ORDER — HYDROMORPHONE HCL PF 1 MG/ML IJ SOLN
0.2500 mg | INTRAMUSCULAR | Status: DC | PRN
  Administered 2012-05-12 (×4): 0.5 mg via INTRAVENOUS

## 2012-05-12 MED ORDER — INFLUENZA VIRUS VACC SPLIT PF IM SUSP
0.5000 mL | INTRAMUSCULAR | Status: AC
  Administered 2012-05-13: 0.5 mL via INTRAMUSCULAR
  Filled 2012-05-12: qty 0.5

## 2012-05-12 MED ORDER — ONDANSETRON HCL 4 MG/2ML IJ SOLN
4.0000 mg | Freq: Once | INTRAMUSCULAR | Status: AC | PRN
  Administered 2012-05-12: 4 mg via INTRAVENOUS

## 2012-05-12 MED ORDER — GLYCOPYRROLATE 0.2 MG/ML IJ SOLN
INTRAMUSCULAR | Status: DC | PRN
  Administered 2012-05-12: .6 mg via INTRAVENOUS

## 2012-05-12 MED ORDER — HYDROMORPHONE HCL PF 1 MG/ML IJ SOLN
1.0000 mg | INTRAMUSCULAR | Status: DC | PRN
  Administered 2012-05-13: 1 mg via INTRAVENOUS
  Filled 2012-05-12: qty 1

## 2012-05-12 MED ORDER — HYDROMORPHONE HCL PF 1 MG/ML IJ SOLN
INTRAMUSCULAR | Status: AC
  Filled 2012-05-12: qty 1

## 2012-05-12 MED ORDER — 0.9 % SODIUM CHLORIDE (POUR BTL) OPTIME
TOPICAL | Status: DC | PRN
  Administered 2012-05-12: 1000 mL

## 2012-05-12 MED ORDER — ROCURONIUM BROMIDE 100 MG/10ML IV SOLN
INTRAVENOUS | Status: DC | PRN
  Administered 2012-05-12: 40 mg via INTRAVENOUS

## 2012-05-12 MED ORDER — ACETAMINOPHEN 10 MG/ML IV SOLN
1000.0000 mg | Freq: Once | INTRAVENOUS | Status: AC | PRN
  Administered 2012-05-12: 1000 mg via INTRAVENOUS
  Filled 2012-05-12: qty 100

## 2012-05-12 MED ORDER — BUPIVACAINE HCL (PF) 0.25 % IJ SOLN
INTRAMUSCULAR | Status: AC
  Filled 2012-05-12: qty 30

## 2012-05-12 MED ORDER — PNEUMOCOCCAL VAC POLYVALENT 25 MCG/0.5ML IJ INJ
0.5000 mL | INJECTION | INTRAMUSCULAR | Status: AC
  Administered 2012-05-13: 0.5 mL via INTRAMUSCULAR
  Filled 2012-05-12: qty 0.5

## 2012-05-12 MED ORDER — OXYCODONE-ACETAMINOPHEN 5-325 MG PO TABS
1.0000 | ORAL_TABLET | ORAL | Status: DC | PRN
  Administered 2012-05-12 – 2012-05-13 (×2): 2 via ORAL
  Filled 2012-05-12 (×2): qty 2

## 2012-05-12 MED ORDER — MIDAZOLAM HCL 5 MG/5ML IJ SOLN
INTRAMUSCULAR | Status: DC | PRN
  Administered 2012-05-12: 2 mg via INTRAVENOUS

## 2012-05-12 MED ORDER — HYDROMORPHONE HCL PF 1 MG/ML IJ SOLN
1.0000 mg | INTRAMUSCULAR | Status: DC | PRN
  Administered 2012-05-12 (×2): 1 mg via INTRAVENOUS
  Filled 2012-05-12 (×2): qty 1

## 2012-05-12 MED ORDER — LACTATED RINGERS IV SOLN
INTRAVENOUS | Status: DC
  Administered 2012-05-12 – 2012-05-13 (×2): via INTRAVENOUS

## 2012-05-12 MED ORDER — FENTANYL CITRATE 0.05 MG/ML IJ SOLN
INTRAMUSCULAR | Status: DC | PRN
  Administered 2012-05-12 (×3): 50 ug via INTRAVENOUS
  Administered 2012-05-12: 100 ug via INTRAVENOUS

## 2012-05-12 MED ORDER — ONDANSETRON HCL 4 MG/2ML IJ SOLN
4.0000 mg | Freq: Four times a day (QID) | INTRAMUSCULAR | Status: DC | PRN
  Administered 2012-05-12: 4 mg via INTRAVENOUS
  Filled 2012-05-12: qty 2

## 2012-05-12 MED ORDER — BUPIVACAINE HCL 0.25 % IJ SOLN
INTRAMUSCULAR | Status: DC | PRN
  Administered 2012-05-12: 30 mL

## 2012-05-12 MED ORDER — SODIUM CHLORIDE 0.9 % IR SOLN
Status: DC | PRN
  Administered 2012-05-12: 1000 mL

## 2012-05-12 MED ORDER — KCL IN DEXTROSE-NACL 20-5-0.9 MEQ/L-%-% IV SOLN
INTRAVENOUS | Status: DC
  Administered 2012-05-12: 12:00:00 via INTRAVENOUS
  Filled 2012-05-12 (×3): qty 1000

## 2012-05-12 MED ORDER — CIPROFLOXACIN IN D5W 400 MG/200ML IV SOLN
400.0000 mg | Freq: Two times a day (BID) | INTRAVENOUS | Status: DC
  Administered 2012-05-12 – 2012-05-13 (×2): 400 mg via INTRAVENOUS
  Filled 2012-05-12 (×3): qty 200

## 2012-05-12 MED ORDER — LIDOCAINE HCL (CARDIAC) 20 MG/ML IV SOLN
INTRAVENOUS | Status: DC | PRN
  Administered 2012-05-12: 20 mg via INTRAVENOUS

## 2012-05-12 MED ORDER — LACTATED RINGERS IV SOLN
INTRAVENOUS | Status: DC | PRN
  Administered 2012-05-12 (×2): via INTRAVENOUS

## 2012-05-12 MED ORDER — PROPOFOL 10 MG/ML IV BOLUS
INTRAVENOUS | Status: DC | PRN
  Administered 2012-05-12: 160 mg via INTRAVENOUS

## 2012-05-12 SURGICAL SUPPLY — 36 items
APPLIER CLIP 5 13 M/L LIGAMAX5 (MISCELLANEOUS) ×2
BENZOIN TINCTURE PRP APPL 2/3 (GAUZE/BANDAGES/DRESSINGS) ×2 IMPLANT
CANISTER SUCTION 2500CC (MISCELLANEOUS) ×2 IMPLANT
CHLORAPREP W/TINT 26ML (MISCELLANEOUS) ×2 IMPLANT
CLIP APPLIE 5 13 M/L LIGAMAX5 (MISCELLANEOUS) ×1 IMPLANT
CLOTH BEACON ORANGE TIMEOUT ST (SAFETY) ×2 IMPLANT
COVER MAYO STAND STRL (DRAPES) ×2 IMPLANT
COVER SURGICAL LIGHT HANDLE (MISCELLANEOUS) ×2 IMPLANT
DECANTER SPIKE VIAL GLASS SM (MISCELLANEOUS) ×4 IMPLANT
DEVICE TROCAR PUNCTURE CLOSURE (ENDOMECHANICALS) IMPLANT
DRAPE C-ARM 42X72 X-RAY (DRAPES) ×2 IMPLANT
DRAPE UTILITY XL STRL (DRAPES) ×4 IMPLANT
ELECT REM PT RETURN 9FT ADLT (ELECTROSURGICAL) ×2
ELECTRODE REM PT RTRN 9FT ADLT (ELECTROSURGICAL) ×1 IMPLANT
GAUZE SPONGE 2X2 8PLY STRL LF (GAUZE/BANDAGES/DRESSINGS) ×1 IMPLANT
GLOVE BIO SURGEON STRL SZ7.5 (GLOVE) ×2 IMPLANT
GOWN STRL NON-REIN LRG LVL3 (GOWN DISPOSABLE) ×8 IMPLANT
IV CATH 14GX2 1/4 (CATHETERS) ×2 IMPLANT
KIT BASIN OR (CUSTOM PROCEDURE TRAY) ×2 IMPLANT
KIT ROOM TURNOVER OR (KITS) ×2 IMPLANT
NEEDLE INSUFFLATION 14GA 120MM (NEEDLE) ×2 IMPLANT
NS IRRIG 1000ML POUR BTL (IV SOLUTION) ×2 IMPLANT
PAD ARMBOARD 7.5X6 YLW CONV (MISCELLANEOUS) ×4 IMPLANT
POUCH SPECIMEN RETRIEVAL 10MM (ENDOMECHANICALS) IMPLANT
SCISSORS LAP 5X35 DISP (ENDOMECHANICALS) ×2 IMPLANT
SET CHOLANGIOGRAPHY FRANKLIN (SET/KITS/TRAYS/PACK) ×2 IMPLANT
SET IRRIG TUBING LAPAROSCOPIC (IRRIGATION / IRRIGATOR) ×2 IMPLANT
SLEEVE ENDOPATH XCEL 5M (ENDOMECHANICALS) ×4 IMPLANT
SPECIMEN JAR SMALL (MISCELLANEOUS) ×2 IMPLANT
SPONGE GAUZE 2X2 STER 10/PKG (GAUZE/BANDAGES/DRESSINGS) ×1
SUT MNCRL AB 3-0 PS2 18 (SUTURE) ×2 IMPLANT
TOWEL OR 17X24 6PK STRL BLUE (TOWEL DISPOSABLE) ×2 IMPLANT
TOWEL OR 17X26 10 PK STRL BLUE (TOWEL DISPOSABLE) ×2 IMPLANT
TRAY LAPAROSCOPIC (CUSTOM PROCEDURE TRAY) ×2 IMPLANT
TROCAR XCEL NON-BLD 11X100MML (ENDOMECHANICALS) ×2 IMPLANT
TROCAR XCEL NON-BLD 5MMX100MML (ENDOMECHANICALS) ×2 IMPLANT

## 2012-05-12 NOTE — Anesthesia Postprocedure Evaluation (Signed)
  Anesthesia Post-op Note  Patient: Patricia Hall  Procedure(s) Performed: Procedure(s) (LRB) with comments: LAPAROSCOPIC CHOLECYSTECTOMY WITH INTRAOPERATIVE CHOLANGIOGRAM (N/A)  Patient Location: PACU  Anesthesia Type:General  Level of Consciousness: awake, alert  and oriented  Airway and Oxygen Therapy: Patient Spontanous Breathing and Patient connected to nasal cannula oxygen  Post-op Pain: mild  Post-op Assessment: Post-op Vital signs reviewed and Patient's Cardiovascular Status Stable  Post-op Vital Signs: stable  Complications: No apparent anesthesia complications

## 2012-05-12 NOTE — Anesthesia Postprocedure Evaluation (Signed)
  Anesthesia Post-op Note  Patient: Patricia Hall  Procedure(s) Performed: Procedure(s) (LRB) with comments: LAPAROSCOPIC CHOLECYSTECTOMY WITH INTRAOPERATIVE CHOLANGIOGRAM (N/A)  Patient Location: PACU  Anesthesia Type:General  Level of Consciousness: awake, alert  and oriented  Airway and Oxygen Therapy: Patient Spontanous Breathing and Patient connected to nasal cannula oxygen  Post-op Pain: mild  Post-op Assessment: Post-op Vital signs reviewed, Patient's Cardiovascular Status Stable, Respiratory Function Stable, Patent Airway, No signs of Nausea or vomiting, Adequate PO intake and Pain level controlled  Post-op Vital Signs: Reviewed and stable  Complications: No apparent anesthesia complications

## 2012-05-12 NOTE — Progress Notes (Signed)
Pt states a tooth on right upper back "feels looser" than it was preop.  Dr Noreene Larsson aware.

## 2012-05-12 NOTE — Patient Instructions (Signed)

## 2012-05-12 NOTE — Transfer of Care (Signed)
Immediate Anesthesia Transfer of Care Note  Patient: Patricia Hall  Procedure(s) Performed: Procedure(s) (LRB) with comments: LAPAROSCOPIC CHOLECYSTECTOMY WITH INTRAOPERATIVE CHOLANGIOGRAM (N/A)  Patient Location: PACU  Anesthesia Type:General  Level of Consciousness: awake, alert  and oriented  Airway & Oxygen Therapy: Patient connected to nasal cannula oxygen  Post-op Assessment: Report given to PACU RN, Post -op Vital signs reviewed and stable and Patient moving all extremities X 4  Post vital signs: Reviewed and stable  Complications: No apparent anesthesia complications

## 2012-05-12 NOTE — Progress Notes (Signed)
Patient ID: Patricia Hall, female   DOB: 15-Dec-1965, 46 y.o.   MRN: [id number]  Chief Complaint  Patient presents with  . Pre-op Exam    eval gallbladder with stones    HPI Patricia Hall is a 46 y.o. female.  HPI and patient sent at the request of Dr. Opiates do to right upper quadrant pain and gallstones. She was seen at [facility] long emergency room on Saturday with a one-day history of right upper quadrant pain after eating hot dogs. Ultrasound showed gallstones without gallbladder wall thickening. Her white count was normal and she had some relief the pain was discharged home. She's taking narcotics around the clock continues to have severe right upper quadrant pain. No nausea or vomiting. Her pain is severe, located in the right upper quadrant and made better narcotics but the medicines aren't lasting very long the pain returns. It is constant. Radius her back.  Past Medical History  Diagnosis Date  . Hep C w/ coma, chronic   . Bipolar 1 disorder   . Substance abuse     Past Surgical History  Procedure Date  . Appendectomy   . Eye surgery     History reviewed. No pertinent family history.  Social History History  Substance Use Topics  . Smoking status: Current Every Day Smoker -- 1.0 packs/day    Types: Cigarettes  . Smokeless tobacco: Not on file  . Alcohol Use: No    No Known Allergies  Current Outpatient Prescriptions  Medication Sig Dispense Refill  . hydrOXYzine (ATARAX/VISTARIL) 25 MG tablet Take 25 mg by mouth 3 (three) times daily as needed. For anxiety      . lamoTRIgine (LAMICTAL) 200 MG tablet Take 200 mg by mouth at bedtime.       . ondansetron (ZOFRAN) 4 MG tablet Take 1 tablet (4 mg total) by mouth every 6 (six) hours.  12 tablet  0  . oxyCODONE-acetaminophen (PERCOCET/ROXICET) 5-325 MG per tablet Take 2 tablets by mouth every 4 (four) hours as needed for pain.  15 tablet  0  . traZODone (DESYREL) 100 MG tablet Take 100 mg by mouth at bedtime.         Review of Systems Review of Systems  Constitutional: Negative.   HENT: Negative.   Eyes: Negative.   Respiratory: Negative.   Cardiovascular: Negative.   Gastrointestinal: Positive for nausea, vomiting and abdominal pain.  Genitourinary: Negative.   Musculoskeletal: Negative.   Neurological: Negative.   Hematological: Negative.   Psychiatric/Behavioral: Negative.     Blood pressure 102/72, pulse 77, temperature 97.5 F (36.4 C), temperature source Temporal, height 5' 1.5" (1.562 m), weight 206 lb 3.2 oz (93.532 kg), last menstrual period 03/17/2012, SpO2 93.00%.  Physical Exam Physical Exam  Constitutional: She is oriented to person, place, and time. She appears well-developed and well-nourished.  HENT:  Head: Normocephalic and atraumatic.  Eyes: EOM are normal. Pupils are equal, round, and reactive to light.  Neck: Normal range of motion. Neck supple.  Cardiovascular: Normal rate and regular rhythm.   Pulmonary/Chest: Effort normal and breath sounds normal.  Abdominal: There is hepatosplenomegaly. There is tenderness in the right upper quadrant and epigastric area. There is positive Murphy's sign. There is no CVA tenderness. No hernia.  Musculoskeletal: Normal range of motion.  Neurological: She is alert and oriented to person, place, and time.  Skin: Skin is warm and dry.  Psychiatric: She has a normal mood and affect. Her behavior is normal. Judgment and thought content normal.  Data Reviewed  *RADIOLOGY REPORT*  Clinical Data: Right upper quadrant pain. History of hepatitis C.  LIMITED ABDOMINAL ULTRASOUND - RIGHT UPPER QUADRANT  Comparison: None.  Findings:  Gallbladder: 9 mm stone in the gallbladder neck. No gallbladder  distension, wall thickening, edema, or sludge. Murphy's sign is  reportedly positive.  Common bile duct: Normal caliber with measured diameter of 5 mm.  Liver: Mild increased hepatic echotexture diffusely which may  correlate with the  patient's history of hepatitis C or may be due  to fatty infiltration.  IMPRESSION:  9 mm stone in the gallbladder neck with positive Murphy's sign.  Changes are nonspecific but could be associated with acute  cholecystitis in the appropriate clinical setting.  Original Report Authenticated By: Marlon Pel, M.D. [facility]     Component Value Date/Time   NA 137 05/10/2012 0258   K 4.2 05/10/2012 0258   CL 102 05/10/2012 0258   CO2 26 05/10/2012 0258   GLUCOSE 99 05/10/2012 0258   BUN 12 05/10/2012 0258   CREATININE 0.70 05/10/2012 0258   CALCIUM 9.4 05/10/2012 0258   PROT 7.5 05/10/2012 0258   ALBUMIN 3.3* 05/10/2012 0258   AST 20 05/10/2012 0258   ALT 11 05/10/2012 0258   ALKPHOS 107 05/10/2012 0258   BILITOT 0.2* 05/10/2012 0258   GFRNONAA >90 05/10/2012 0258   GFRAA >90 05/10/2012 0258   CBC    Component Value Date/Time   WBC 8.8 05/10/2012 0258   RBC 4.47 05/10/2012 0258   HGB 13.6 05/10/2012 0258   HCT 40.0 05/10/2012 0258   PLT 266 05/10/2012 0258   MCV 89.5 05/10/2012 0258   MCH 30.4 05/10/2012 0258   MCHC 34.0 05/10/2012 0258   RDW 13.9 05/10/2012 0258   LYMPHSABS 2.8 04/27/2011 2300   MONOABS 0.9 04/27/2011 2300   EOSABS 0.2 04/27/2011 2300   BASOSABS 0.0 04/27/2011 2300     Assessment     acute cholecystitis  Plan    Admission hospital for IV fluids, IV antibiotics and laparoscopic cholecystectomy per Dr. Derrell Lolling. Discussed with PA and Dr. Derrell Lolling.       Patricia Hall A. 05/12/2012, 9:45 AM

## 2012-05-12 NOTE — Progress Notes (Signed)
Patient to O.R. via bed. IV antibiotic (Cipro) sent with patient.

## 2012-05-12 NOTE — Anesthesia Preprocedure Evaluation (Addendum)
Anesthesia Evaluation  Patient identified by MRN, date of birth, ID band Patient awake    Reviewed: Allergy & Precautions, H&P , NPO status , Patient's Chart, lab work & pertinent test results  Airway Mallampati: II      Dental  (+) Poor Dentition and Loose,    Pulmonary neg pulmonary ROS,  breath sounds clear to auscultation        Cardiovascular negative cardio ROS  Rhythm:Regular Rate:Normal     Neuro/Psych Anxiety    GI/Hepatic (+) Hepatitis -, CHistory of gallstones.   Endo/Other  negative endocrine ROS  Renal/GU negative Renal ROS     Musculoskeletal   Abdominal (+) + obese,   Peds  Hematology negative hematology ROS (+)   Anesthesia Other Findings   Reproductive/Obstetrics                         Anesthesia Physical Anesthesia Plan  ASA: III  Anesthesia Plan: General   Post-op Pain Management:    Induction: Intravenous  Airway Management Planned: Oral ETT  Additional Equipment:   Intra-op Plan:   Post-operative Plan: Extubation in OR  Informed Consent: I have reviewed the patients History and Physical, chart, labs and discussed the procedure including the risks, benefits and alternatives for the proposed anesthesia with the patient or authorized representative who has indicated his/her understanding and acceptance.   Dental advisory given  Plan Discussed with: CRNA, Anesthesiologist and Surgeon  Anesthesia Plan Comments: (Cholelithiasis  Smoker H/O Hepatitis C Bipolar disorder Obesity  Plan GA  Kipp Brood, MD)      Anesthesia Quick Evaluation

## 2012-05-12 NOTE — Anesthesia Procedure Notes (Signed)
Procedure Name: Intubation Date/Time: 05/12/2012 7:55 PM Performed by: Molli Hazard Pre-anesthesia Checklist: Patient identified, Emergency Drugs available, Suction available and Patient being monitored Patient Re-evaluated:Patient Re-evaluated prior to inductionOxygen Delivery Method: Circle system utilized Preoxygenation: Pre-oxygenation with 100% oxygen Intubation Type: IV induction Ventilation: Mask ventilation without difficulty Laryngoscope Size: Miller and 2 Grade View: Grade I Tube type: Oral Tube size: 7.5 mm Number of attempts: 1 Airway Equipment and Method: Stylet Placement Confirmation: ETT inserted through vocal cords under direct vision,  positive ETCO2 and breath sounds checked- equal and bilateral Secured at: 23 cm Tube secured with: Tape Dental Injury: Teeth and Oropharynx as per pre-operative assessment

## 2012-05-12 NOTE — Progress Notes (Signed)
Pt is very tense, anxious, feels like she is having a panic attack.  Req. freq reassurance.

## 2012-05-12 NOTE — Op Note (Signed)
Pre Operative Diagnosis: acute cholecystitis  Post Operative Diagnosis: chronic cholecystitis  Procedure: Cholecystectomy with intraoperative cholangiogram  Surgeon: Dr. Axel Filler   Assistant: Dr. Janee Morn  Anesthesia: Gen. Endotracheal anesthesia   EBL: 5 cc  Complications:  Counts: reported as correct x 2   Findings: The patient had a normal intraoperative cholangiogram filling of the duodenum and without any filling defects  Indications for procedure: the patient is a 46 year old female with history of right upper quadrant pain for 24-48 hours patient ultrasound revealed signs consistent with acute cholecystitis this patient taken back urgently for  Details of the procedure:  The patient was taken to the operating and placed in the supine position with bilateral SCDs in place. A time out was called and all facts were verified. A pneumoperitoneum was obtained via A Veress needle technique to a pressure of 14mm of mercury. A 5mm trochar was then placed in the right upper quadrant under visualization, and there were no injuries to any abdominal organs. A 11 mm port was then placed in the umbilical region after infiltrating with local anesthesia under direct visualization. A second and third epigastric port and right lower quadrant port placement under direct visualization, respectively. The gallbladder was identified and retracted, the peritoneum was then sharply dissected from the gallbladder and this dissection was carried down to Calot's triangle. The gallbladder was identified and stripped away circumferentially and seen going into the gallbladder 360. A Ronnell Freshwater catheter was used to perform an intraoperative cholangiogram. The biliary radicals as well as the cystic duct and common bile duct were seen free of filling defects.  2 clips were placed proximally one distally and the cystic duct transected. The cystic artery was identified and 2 clips placed proximally and one  distally and transected.  We then proceeded to remove the gallbladder off the hepatic fossa with Bovie cautery. An Endo Catch bag was then placed in the abdomen and gallbladder placed in the bag. The hepatic fossa was then reexamined and hemostasis was achieved with Bovie cautery and was excellent at the end of the case. The subhepatic fossa and perihepatic fossa was then irrigated until the effluent was clear. The 11 mm trocar fascia was reapproximated with the Endo Close #1 Vicryl.  The pneumoperitoneum was evacuated and all trochars removed under direct visulalization.  The skin was then closed with 4-0 Monocryl and the skin dressed with Steri-Strips, gauze, and tape.  The patient was awaken from general anesthesia and taken to the recovery room in stable condition.

## 2012-05-13 MED ORDER — OXYCODONE-ACETAMINOPHEN 5-325 MG PO TABS: 1.0000 | ORAL_TABLET | ORAL | Status: DC | PRN

## 2012-05-13 NOTE — Progress Notes (Signed)
Pt is requesting a nicotine patch to be ordered for today

## 2012-05-13 NOTE — Discharge Summary (Signed)
  Patient ID: Patricia Hall MRN: [id number] DOB/AGE: [birth year] 46 y.o.  Admit date: 05/12/2012 Discharge date: 05/13/2012  Procedures: laparoscopic cholecystectomy  Consults: None  Reason for Admission: This is a 46 yo female who was admitted with RUQ abdominal pain and findings concerning for acute cholecystitis  Admission Diagnoses:  1, acute cholecystitis  Hospital Course: The patient was admitted and taken to the operating room where she underwent a lap chole.  She tolerated this well.  She was tolerating a solid diet and oral pain meds on POD# 1 and was stable for dc home.  PE: Abd: soft, appropriately tender, +BS, incisions c/d/i with gauze and tape present  Discharge Diagnoses:  1. Acute cholecystitis, s/p lap chole  Discharge Medications:   Medication List     As of 05/13/2012  9:44 AM    TAKE these medications         hydrOXYzine 25 MG tablet   Commonly known as: ATARAX/VISTARIL   Take 25 mg by mouth 3 (three) times daily as needed. For anxiety      lamoTRIgine 200 MG tablet   Commonly known as: LAMICTAL   Take 200 mg by mouth at bedtime.      ondansetron 4 MG tablet   Commonly known as: ZOFRAN   Take 1 tablet (4 mg total) by mouth every 6 (six) hours.      oxyCODONE-acetaminophen 5-325 MG per tablet   Commonly known as: PERCOCET/ROXICET   Take 1-2 tablets by mouth every 4 (four) hours as needed for pain.      traZODone 100 MG tablet   Commonly known as: DESYREL   Take 100 mg by mouth at bedtime.        Discharge Instructions:     Follow-up Information    Follow up with [facility]. On 06/02/2012. (2:30pm, arrive at 2:00pm)    Contact information:   [address]   [place] [address] [phone]          Signed: Letha Cape 05/13/2012, 9:44 AM

## 2012-05-13 NOTE — Progress Notes (Signed)
Patient discharged to home with instructions, verbalized understanding. 

## 2012-05-13 NOTE — Progress Notes (Signed)
UR chart review completed.  

## 2012-05-14 ENCOUNTER — Encounter (HOSPITAL_COMMUNITY): Payer: Self-pay | Admitting: General Surgery

## 2012-06-02 ENCOUNTER — Encounter (INDEPENDENT_AMBULATORY_CARE_PROVIDER_SITE_OTHER): Payer: Medicare Other

## 2012-11-02 ENCOUNTER — Encounter (HOSPITAL_COMMUNITY): Payer: Self-pay | Admitting: *Deleted

## 2012-11-02 ENCOUNTER — Encounter: Payer: Self-pay | Admitting: Emergency Medicine

## 2012-11-02 ENCOUNTER — Emergency Department (HOSPITAL_COMMUNITY)
Admission: EM | Admit: 2012-11-02 | Discharge: 2012-11-02 | Disposition: A | Discharge status: Home / Self Care | Payer: Medicare Other | Attending: Emergency Medicine | Admitting: Emergency Medicine

## 2012-11-02 DIAGNOSIS — Z79899 Other long term (current) drug therapy: Secondary | ICD-10-CM | POA: Insufficient documentation

## 2012-11-02 DIAGNOSIS — N898 Other specified noninflammatory disorders of vagina: Secondary | ICD-10-CM | POA: Insufficient documentation

## 2012-11-02 DIAGNOSIS — F319 Bipolar disorder, unspecified: Secondary | ICD-10-CM | POA: Insufficient documentation

## 2012-11-02 DIAGNOSIS — Z8719 Personal history of other diseases of the digestive system: Secondary | ICD-10-CM | POA: Insufficient documentation

## 2012-11-02 DIAGNOSIS — F17200 Nicotine dependence, unspecified, uncomplicated: Secondary | ICD-10-CM | POA: Insufficient documentation

## 2012-11-02 DIAGNOSIS — F1910 Other psychoactive substance abuse, uncomplicated: Secondary | ICD-10-CM | POA: Insufficient documentation

## 2012-11-02 DIAGNOSIS — B182 Chronic viral hepatitis C: Secondary | ICD-10-CM | POA: Insufficient documentation

## 2012-11-02 DIAGNOSIS — N72 Inflammatory disease of cervix uteri: Secondary | ICD-10-CM | POA: Insufficient documentation

## 2012-11-02 LAB — CBC
Hemoglobin: 13.3 g/dL (ref 12.0–15.0)
MCH: 30.2 pg (ref 26.0–34.0)
MCHC: 29.8 g/dL — ABNORMAL LOW (ref 30.0–36.0)
RDW: 14.9 % (ref 11.5–15.5)

## 2012-11-02 LAB — WET PREP, GENITAL: Clue Cells Wet Prep HPF POC: NONE SEEN

## 2012-11-02 LAB — URINALYSIS, ROUTINE W REFLEX MICROSCOPIC
Glucose, UA: NEGATIVE mg/dL
Ketones, ur: NEGATIVE mg/dL
Leukocytes, UA: NEGATIVE
Nitrite: NEGATIVE
Protein, ur: NEGATIVE mg/dL
Urobilinogen, UA: 0.2 mg/dL (ref 0.0–1.0)

## 2012-11-02 LAB — URINE MICROSCOPIC-ADD ON

## 2012-11-02 MED ORDER — OXYCODONE-ACETAMINOPHEN 5-325 MG PO TABS
1.0000 | ORAL_TABLET | Freq: Once | ORAL | Status: AC
  Administered 2012-11-02: 1 via ORAL
  Filled 2012-11-02: qty 1

## 2012-11-02 MED ORDER — DOXYCYCLINE HYCLATE 100 MG PO CAPS: 100.0000 mg | ORAL_CAPSULE | Freq: Two times a day (BID) | ORAL | Status: DC

## 2012-11-02 MED ORDER — LIDOCAINE HCL (PF) 1 % IJ SOLN
INTRAMUSCULAR | Status: AC
  Administered 2012-11-02: 2 mL
  Filled 2012-11-02: qty 5

## 2012-11-02 MED ORDER — ONDANSETRON HCL 4 MG/2ML IJ SOLN
INTRAMUSCULAR | Status: AC
  Filled 2012-11-02: qty 2

## 2012-11-02 MED ORDER — CEFTRIAXONE SODIUM 250 MG IJ SOLR
250.0000 mg | Freq: Once | INTRAMUSCULAR | Status: AC
  Administered 2012-11-02: 250 mg via INTRAMUSCULAR
  Filled 2012-11-02: qty 250

## 2012-11-02 MED ORDER — DOXYCYCLINE HYCLATE 100 MG PO TABS
100.0000 mg | ORAL_TABLET | Freq: Once | ORAL | Status: AC
  Administered 2012-11-02: 100 mg via ORAL
  Filled 2012-11-02: qty 1

## 2012-11-02 NOTE — ED Provider Notes (Signed)
Medical screening examination/treatment/procedure(s) were performed by non-physician practitioner and as supervising physician I was immediately available for consultation/collaboration.  Taydem Cavagnaro L Mariajose Mow, MD 11/02/12 1738 

## 2012-11-02 NOTE — ED Provider Notes (Signed)
History     CSN: [id number]  Arrival date & time 11/02/12  0801   First MD Initiated Contact with Patient 11/02/12 0805      Chief Complaint  Patient presents with  . Back Pain    (Consider location/radiation/quality/duration/timing/severity/associated sxs/prior treatment) HPI  Patient is a 47 year old female G1 P1 presenting to the emergency room complaining of lower back pain and vaginal discharge started 3 days ago after unprotected sex with a partner of unknown status. Patient is unaware of history of past STIs. Patient had brownish vaginal discharge prior to starting her menstrual cycle. Menstrual cycles are regular per patient. Patient has sharp cramping pelvic pain w/ radiation to low back and buttock region. No alleviating factors. Sexual intercourse aggravates pain. Patient denies fevers, chills, nausea, vomiting, or diarrhea.    Past Medical History  Diagnosis Date  . Hep C w/ coma, chronic   . Bipolar 1 disorder   . Substance abuse   . Cholecystitis 05/12/2012    Past Surgical History  Procedure Laterality Date  . Appendectomy    . Eye surgery    . Cholecystectomy  05/12/2012    Procedure: LAPAROSCOPIC CHOLECYSTECTOMY WITH INTRAOPERATIVE CHOLANGIOGRAM;  Surgeon: Axel Filler, MD;  Location: [facility] OR;  Service: General;  Laterality: N/A;    No family history on file.  History  Substance Use Topics  . Smoking status: Current Every Day Smoker -- 1.00 packs/day for 25 years    Types: Cigarettes  . Smokeless tobacco: Never Used  . Alcohol Use: No    OB History   Grav Para Term Preterm Abortions TAB SAB Ect Mult Living                  Review of Systems  Constitutional: Negative.   HENT: Negative.   Eyes: Negative.   Respiratory: Negative.   Cardiovascular: Negative.   Gastrointestinal: Negative.   Genitourinary: Positive for dysuria, vaginal bleeding, vaginal discharge and pelvic pain. Negative for hematuria.  Musculoskeletal: Positive for back pain.   Skin: Negative.   Neurological: Negative.     Allergies  Review of patient's allergies indicates no known allergies.  Home Medications   Current Outpatient Rx  Name  Route  Sig  Dispense  Refill  . doxepin (SINEQUAN) 10 MG capsule   Oral   Take 10-20 mg by mouth at bedtime.         . hydrOXYzine (ATARAX/VISTARIL) 25 MG tablet   Oral   Take 25 mg by mouth 3 (three) times daily as needed. For anxiety         . ibuprofen (ADVIL,MOTRIN) 200 MG tablet   Oral   Take 800 mg by mouth every 6 (six) hours as needed for pain.         Marland Kitchen lamoTRIgine (LAMICTAL) 200 MG tablet   Oral   Take 200 mg by mouth at bedtime.            BP 119/47  Pulse 78  Temp(Src) 97.7 F (36.5 C) (Oral)  Resp 20  SpO2 97%  LMP 11/02/2012  Physical Exam  Constitutional: She is oriented to person, place, and time. She appears well-developed and well-nourished. No distress.  HENT:  Head: Normocephalic and atraumatic.  Mouth/Throat: Oropharynx is clear and moist.  Eyes: EOM are normal. Pupils are equal, round, and reactive to light.  Neck: Neck supple.  Cardiovascular: Normal rate, regular rhythm and normal heart sounds.   Pulmonary/Chest: Effort normal and breath sounds normal. No respiratory distress.  Abdominal:  Soft. Bowel sounds are normal. There is no tenderness. There is no rigidity and no guarding.  Genitourinary: Uterus normal. Cervix exhibits discharge. Cervix exhibits no friability. Right adnexum displays no mass, no tenderness and no fullness. Left adnexum displays no mass, no tenderness and no fullness. There is bleeding around the vagina.  Right and left pelvic tenderness to palpation.  Patient had mild cervical tenderness to palpation.  Lymphadenopathy:    She has no cervical adenopathy.  Neurological: She is alert and oriented to person, place, and time.  Skin: Skin is warm and dry.  Psychiatric: She has a normal mood and affect.    ED Course  Procedures (including critical  care time)  Labs Reviewed  WET PREP, GENITAL - Abnormal; Notable for the following:    WBC, Wet Prep HPF POC FEW (*)    All other components within normal limits  URINALYSIS, ROUTINE W REFLEX MICROSCOPIC - Abnormal; Notable for the following:    Hgb urine dipstick SMALL (*)    All other components within normal limits  CBC - Abnormal; Notable for the following:    WBC 10.6 (*)    MCV 101.1 (*)    MCHC 29.8 (*)    All other components within normal limits  GC/CHLAMYDIA PROBE AMP  URINE MICROSCOPIC-ADD ON  PREGNANCY, URINE  POCT PREGNANCY, URINE   No results found.   1. Acute cervicitis       MDM  Patient to be discharged with instructions to follow up with OBGYN. Pt understands GC/Chlamydia cultures pending and that they will need to inform all sexual partners within the last 6 months if results return positive. Pt has been treated prophylacticly with doxcycline and rocephin due to pts history, pelvic exam, and wet prep with increased WBCs. Pt advised that she will receive a call in 48 hours if the test is positive and to refrain from sexual activity for 48 hours. If the test is positive, pt is advised to refrain from sexual activity for 10 days for the medicine to take effect.  PE mildly concerning for cervicitis or PID d/t minimal CMT. Pt will be started on doxcycline in ED and as outpatient to cover for both acute cervicitis and PID. Discussed that because pt has had recent unprotected sex, might want to consider getting tested for HIV as well. Counseled pt that latex condoms are the only way to prevent against STDs or HIV. Patient d/w with Dr. Effie Shy, agrees with plan.              Jeannetta Ellis, PA-C 11/02/12 1134

## 2012-11-02 NOTE — ED Notes (Signed)
Pt in from home c/o L sided Lower back pain onset x 3 days, pt denies injury to the area, pt ambulatory upon arrival to ED, pt A&O x4, follows commands, speaks in complete sentences

## 2012-11-03 LAB — GC/CHLAMYDIA PROBE AMP: CT Probe RNA: NEGATIVE

## 2013-04-01 ENCOUNTER — Emergency Department (HOSPITAL_COMMUNITY)
Admission: EM | Admit: 2013-04-01 | Discharge: 2013-04-01 | Disposition: A | Discharge status: Home / Self Care | Payer: Medicare Other | Attending: Emergency Medicine | Admitting: Emergency Medicine

## 2013-04-01 ENCOUNTER — Encounter (HOSPITAL_COMMUNITY): Payer: Self-pay

## 2013-04-01 DIAGNOSIS — R300 Dysuria: Secondary | ICD-10-CM | POA: Diagnosis not present

## 2013-04-01 DIAGNOSIS — J069 Acute upper respiratory infection, unspecified: Secondary | ICD-10-CM | POA: Insufficient documentation

## 2013-04-01 DIAGNOSIS — F17200 Nicotine dependence, unspecified, uncomplicated: Secondary | ICD-10-CM | POA: Diagnosis not present

## 2013-04-01 DIAGNOSIS — N390 Urinary tract infection, site not specified: Secondary | ICD-10-CM | POA: Diagnosis not present

## 2013-04-01 DIAGNOSIS — N898 Other specified noninflammatory disorders of vagina: Secondary | ICD-10-CM

## 2013-04-01 DIAGNOSIS — R509 Fever, unspecified: Secondary | ICD-10-CM | POA: Insufficient documentation

## 2013-04-01 DIAGNOSIS — J029 Acute pharyngitis, unspecified: Secondary | ICD-10-CM | POA: Insufficient documentation

## 2013-04-01 DIAGNOSIS — Z79899 Other long term (current) drug therapy: Secondary | ICD-10-CM | POA: Diagnosis not present

## 2013-04-01 DIAGNOSIS — J3489 Other specified disorders of nose and nasal sinuses: Secondary | ICD-10-CM | POA: Insufficient documentation

## 2013-04-01 DIAGNOSIS — F319 Bipolar disorder, unspecified: Secondary | ICD-10-CM | POA: Diagnosis not present

## 2013-04-01 LAB — URINE MICROSCOPIC-ADD ON

## 2013-04-01 LAB — URINALYSIS, ROUTINE W REFLEX MICROSCOPIC
Bilirubin Urine: NEGATIVE
Ketones, ur: NEGATIVE mg/dL
Protein, ur: NEGATIVE mg/dL
Urobilinogen, UA: 0.2 mg/dL (ref 0.0–1.0)

## 2013-04-01 MED ORDER — CEFTRIAXONE SODIUM 250 MG IJ SOLR
250.0000 mg | Freq: Once | INTRAMUSCULAR | Status: AC
  Administered 2013-04-01: 250 mg via INTRAMUSCULAR
  Filled 2013-04-01: qty 250

## 2013-04-01 MED ORDER — AZITHROMYCIN 250 MG PO TABS
1000.0000 mg | ORAL_TABLET | Freq: Once | ORAL | Status: AC
  Administered 2013-04-01: 1000 mg via ORAL
  Filled 2013-04-01: qty 4

## 2013-04-01 MED ORDER — CEPHALEXIN 500 MG PO CAPS: 500.0000 mg | ORAL_CAPSULE | Freq: Four times a day (QID) | ORAL | Status: DC

## 2013-04-01 NOTE — ED Provider Notes (Signed)
Medical screening examination/treatment/procedure(s) were performed by non-physician practitioner and as supervising physician I was immediately available for consultation/collaboration.   Loren Racer, MD 04/01/13 [phone]

## 2013-04-01 NOTE — ED Notes (Signed)
PA Rob notified pelvic is set up

## 2013-04-01 NOTE — ED Notes (Signed)
Pt sts GU symptoms are the same as when previously diagnosed with Trich.

## 2013-04-01 NOTE — ED Notes (Signed)
Pt escorted to discharge window. Verbalized understanding discharge instructions. In no acute distress.   

## 2013-04-01 NOTE — ED Provider Notes (Signed)
CSN: [id number]     Arrival date & time 04/01/13  0844 History   First MD Initiated Contact with Patient 04/01/13 [phone]     Chief Complaint  Patient presents with  . Nasal Congestion  . Vaginal Discharge   (Consider location/radiation/quality/duration/timing/severity/associated sxs/prior Treatment) HPI Comments: Patient presents emergency department with chief complaint of nasal congestion as well as dysuria and vaginal discharge. She states that she has had cough, sore throat, and nasal congestion for the past 3 days. She denies any productive sputum. She endorses subjective fever, but has not taken temperature. Additionally, she states that she has had some mild dysuria, as well as mild vaginal discharge, and that should like to have evaluated today. She has not tried anything to alleviate her symptoms. Nothing makes her symptoms better or worse.  The history is provided by the patient. No language interpreter was used.    Past Medical History  Diagnosis Date  . Hep C w/ coma, chronic   . Bipolar 1 disorder   . Substance abuse   . Cholecystitis 05/12/2012   Past Surgical History  Procedure Laterality Date  . Appendectomy    . Eye surgery    . Cholecystectomy  05/12/2012    Procedure: LAPAROSCOPIC CHOLECYSTECTOMY WITH INTRAOPERATIVE CHOLANGIOGRAM;  Surgeon: Axel Filler, MD;  Location: [facility];  Service: General;  Laterality: N/A;   History reviewed. No pertinent family history. History  Substance Use Topics  . Smoking status: Current Every Day Smoker -- 1.00 packs/day for 25 years    Types: Cigarettes  . Smokeless tobacco: Never Used  . Alcohol Use: No   OB History   Grav Para Term Preterm Abortions TAB SAB Ect Mult Living                 Review of Systems  All other systems reviewed and are negative.    Allergies  Review of patient's allergies indicates no known allergies.  Home Medications   Current Outpatient Rx  Name  Route  Sig  Dispense  Refill  .  hydrOXYzine (ATARAX/VISTARIL) 25 MG tablet   Oral   Take 25 mg by mouth 3 (three) times daily as needed. For anxiety         . ibuprofen (ADVIL,MOTRIN) 200 MG tablet   Oral   Take 800 mg by mouth every 6 (six) hours as needed for pain.         Marland Kitchen lamoTRIgine (LAMICTAL) 25 MG tablet   Oral   Take 50 mg by mouth 2 (two) times daily.          LMP 02/24/2013 Physical Exam  Nursing note and vitals reviewed. Constitutional: She is oriented to person, place, and time. She appears well-developed and well-nourished.  HENT:  Head: Normocephalic and atraumatic.  Fluid behind the tympanic membranes bilaterally, but the TMs do not appear acutely infected  Eyes: Conjunctivae and EOM are normal. Pupils are equal, round, and reactive to light.  Neck: Normal range of motion. Neck supple.  Cardiovascular: Normal rate and regular rhythm.  Exam reveals no gallop and no friction rub.   No murmur heard. Pulmonary/Chest: Effort normal and breath sounds normal. No respiratory distress. She has no wheezes. She has no rales. She exhibits no tenderness.  Abdominal: Soft. Bowel sounds are normal. She exhibits no distension and no mass. There is no tenderness. There is no rebound and no guarding. Hernia confirmed negative in the right inguinal area and confirmed negative in the left inguinal area.  Genitourinary: No  labial fusion. There is no rash, tenderness, lesion or injury on the right labia. There is no rash, tenderness, lesion or injury on the left labia. Uterus is not deviated, not enlarged, not fixed and not tender. Cervix exhibits no motion tenderness, no discharge and no friability. Right adnexum displays no mass, no tenderness and no fullness. Left adnexum displays no mass, no tenderness and no fullness. No erythema, tenderness or bleeding around the vagina. No foreign body around the vagina. No signs of injury around the vagina. Vaginal discharge found.  Chaperone present for exam  Musculoskeletal:  Normal range of motion. She exhibits no edema and no tenderness.  Lymphadenopathy:       Right: No inguinal adenopathy present.       Left: No inguinal adenopathy present.  Neurological: She is alert and oriented to person, place, and time.  Skin: Skin is warm and dry.  Psychiatric: She has a normal mood and affect. Her behavior is normal. Judgment and thought content normal.    ED Course  Procedures (including critical care time) Labs Review Labs Reviewed  WET PREP, GENITAL  GC/CHLAMYDIA PROBE AMP  URINALYSIS, ROUTINE W REFLEX MICROSCOPIC   Results for orders placed during the hospital encounter of 04/01/13  WET PREP, GENITAL      Result Value Range   Yeast Wet Prep HPF POC FEW (*) NONE SEEN   Trich, Wet Prep NONE SEEN  NONE SEEN   Clue Cells Wet Prep HPF POC NONE SEEN  NONE SEEN   WBC, Wet Prep HPF POC FEW (*) NONE SEEN  URINALYSIS, ROUTINE W REFLEX MICROSCOPIC      Result Value Range   Color, Urine AMBER (*) YELLOW   APPearance TURBID (*) CLEAR   Specific Gravity, Urine 1.040 (*) 1.005 - 1.030   pH 5.0  5.0 - 8.0   Glucose, UA NEGATIVE  NEGATIVE mg/dL   Hgb urine dipstick SMALL (*) NEGATIVE   Bilirubin Urine NEGATIVE  NEGATIVE   Ketones, ur NEGATIVE  NEGATIVE mg/dL   Protein, ur NEGATIVE  NEGATIVE mg/dL   Urobilinogen, UA 0.2  0.0 - 1.0 mg/dL   Nitrite NEGATIVE  NEGATIVE   Leukocytes, UA MODERATE (*) NEGATIVE  URINE MICROSCOPIC-ADD ON      Result Value Range   Squamous Epithelial / LPF FEW (*) RARE   WBC, UA 3-6  <3 WBC/hpf   RBC / HPF 0-2  <3 RBC/hpf   Bacteria, UA MANY (*) RARE   Urine-Other AMORPHOUS URATES/PHOSPHATES       MDM   1. UTI (lower urinary tract infection)   2. Vaginal discharge    Patient with upper respiratory tract infection, as well as vaginal discharge. Patient has had new sexual contacts. Will treat with Rocephin and azithromycin. Encouraged patient to notify her sexual partners of any future positive test results, and that we will call her  in 2 days if her test results are positive. Will also treat UTI with Keflex. Treat URI with Mucinex. Abdomen is benign, pelvic exam is without tenderness. Recommend fluids, and rest. Patient is stable and ready for discharge.  Roxy Horseman, PA-C 04/01/13 [phone]

## 2013-04-01 NOTE — ED Notes (Signed)
Pt c/o cough, sore throat and congestion x 3 days and  dysuria and vaginal discharge x "3-4 days."

## 2013-04-02 LAB — GC/CHLAMYDIA PROBE AMP
CT Probe RNA: NEGATIVE
GC Probe RNA: NEGATIVE

## 2013-04-03 LAB — URINE CULTURE: Colony Count: >=100000

## 2013-04-04 ENCOUNTER — Telehealth (HOSPITAL_COMMUNITY): Payer: Self-pay | Admitting: Emergency Medicine

## 2013-04-04 NOTE — ED Notes (Signed)
Post ED Visit - Positive Culture Follow-up ° °Culture report reviewed by antimicrobial stewardship pharmacist: °[x] Wes Dulaney, Pharm.D., BCPS °[] Jeremy Frens, Pharm.D., BCPS °[] Elizabeth Martin, Pharm.D., BCPS °[] Minh Pham, Pharm.D., BCPS, AAHIVP °[] Michelle Turner, Pharm.D., BCPS, AAHIVP ° °Positive urine culture °Treated with Keflex, organism sensitive to the same and no further patient follow-up is required at this time. ° °Patricia Hall °04/04/2013, 6:05 PM ° ° °

## 2013-05-10 IMAGING — CR DG CHEST 2V
2 series · 2 of 2 positions shown · non-contrast
Comparison: 12/13/2010

CLINICAL DATA: Cough, chest pain, and tightness.  Smoker.

CHEST - 2 VIEW

[w chest pa]
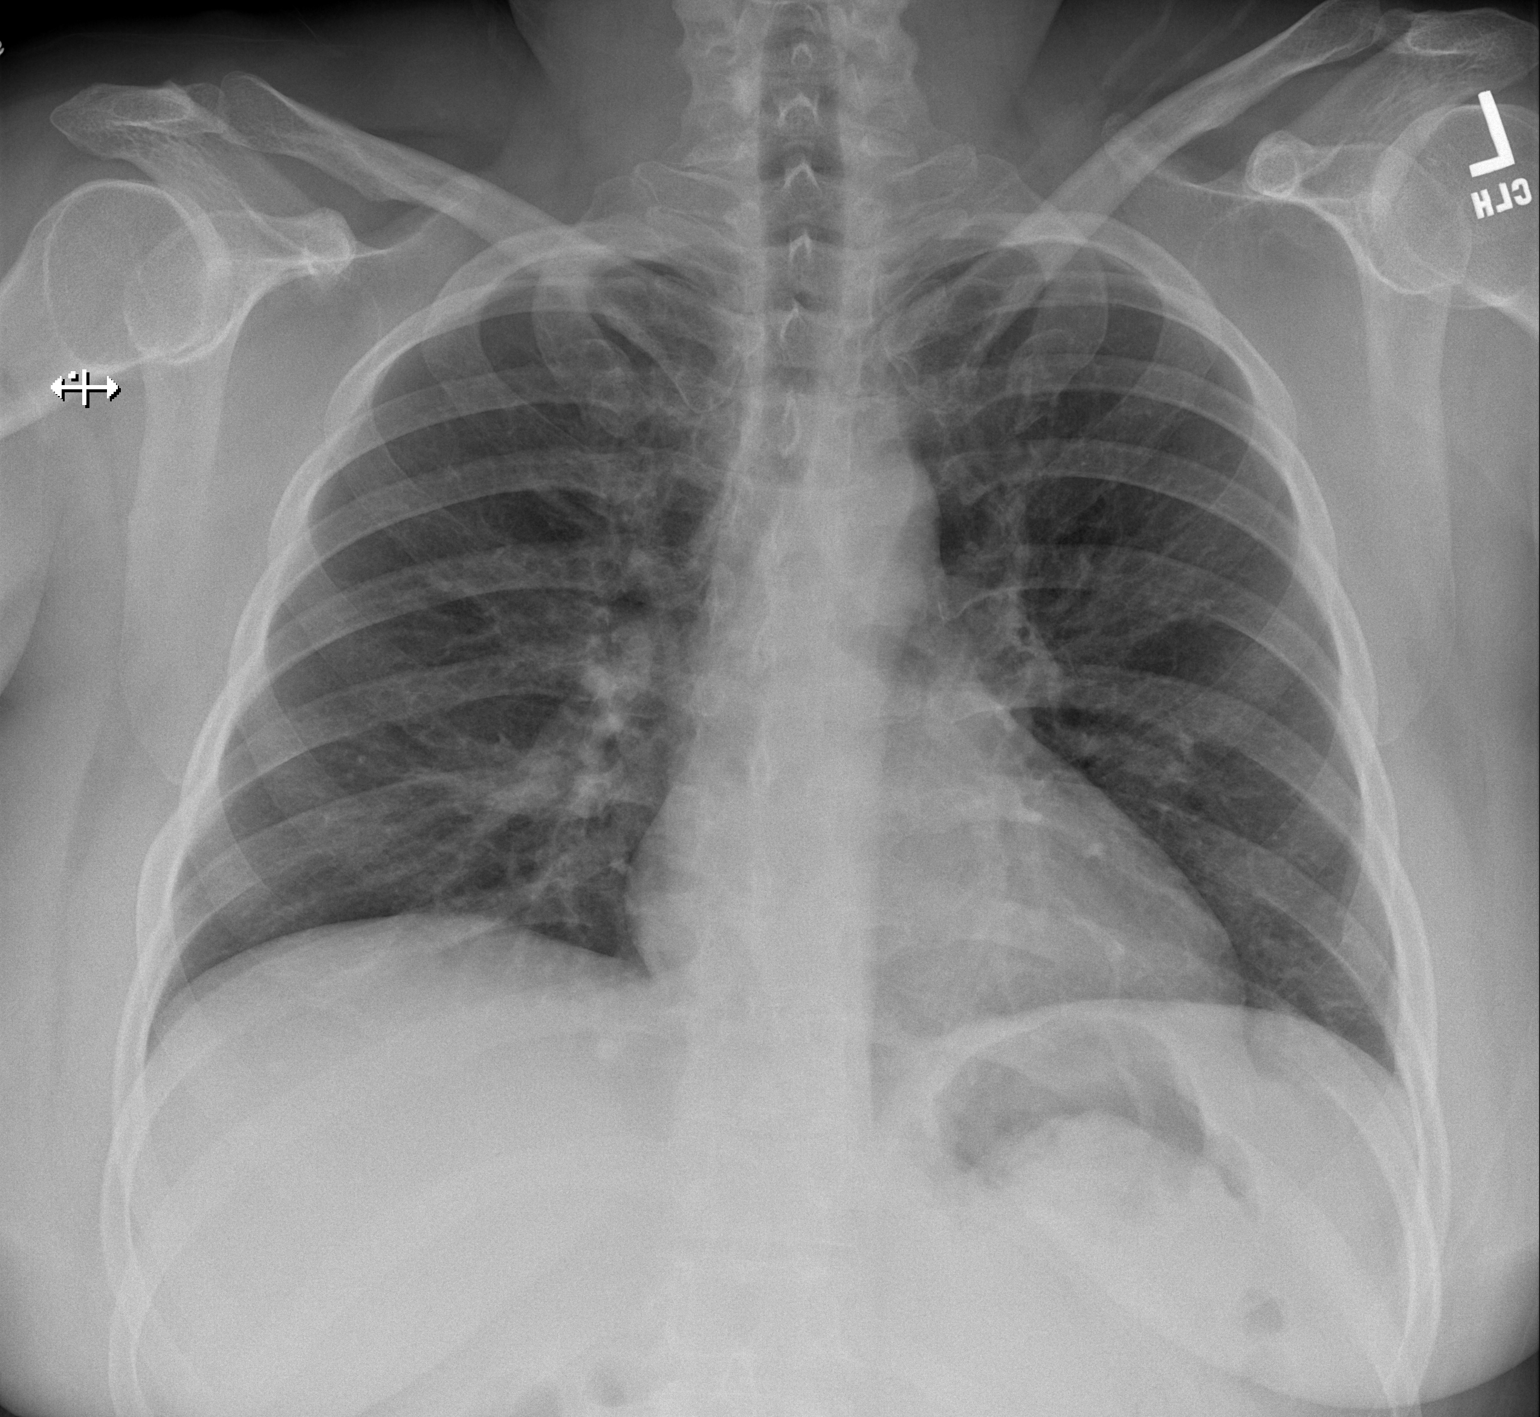

[w chest lat]
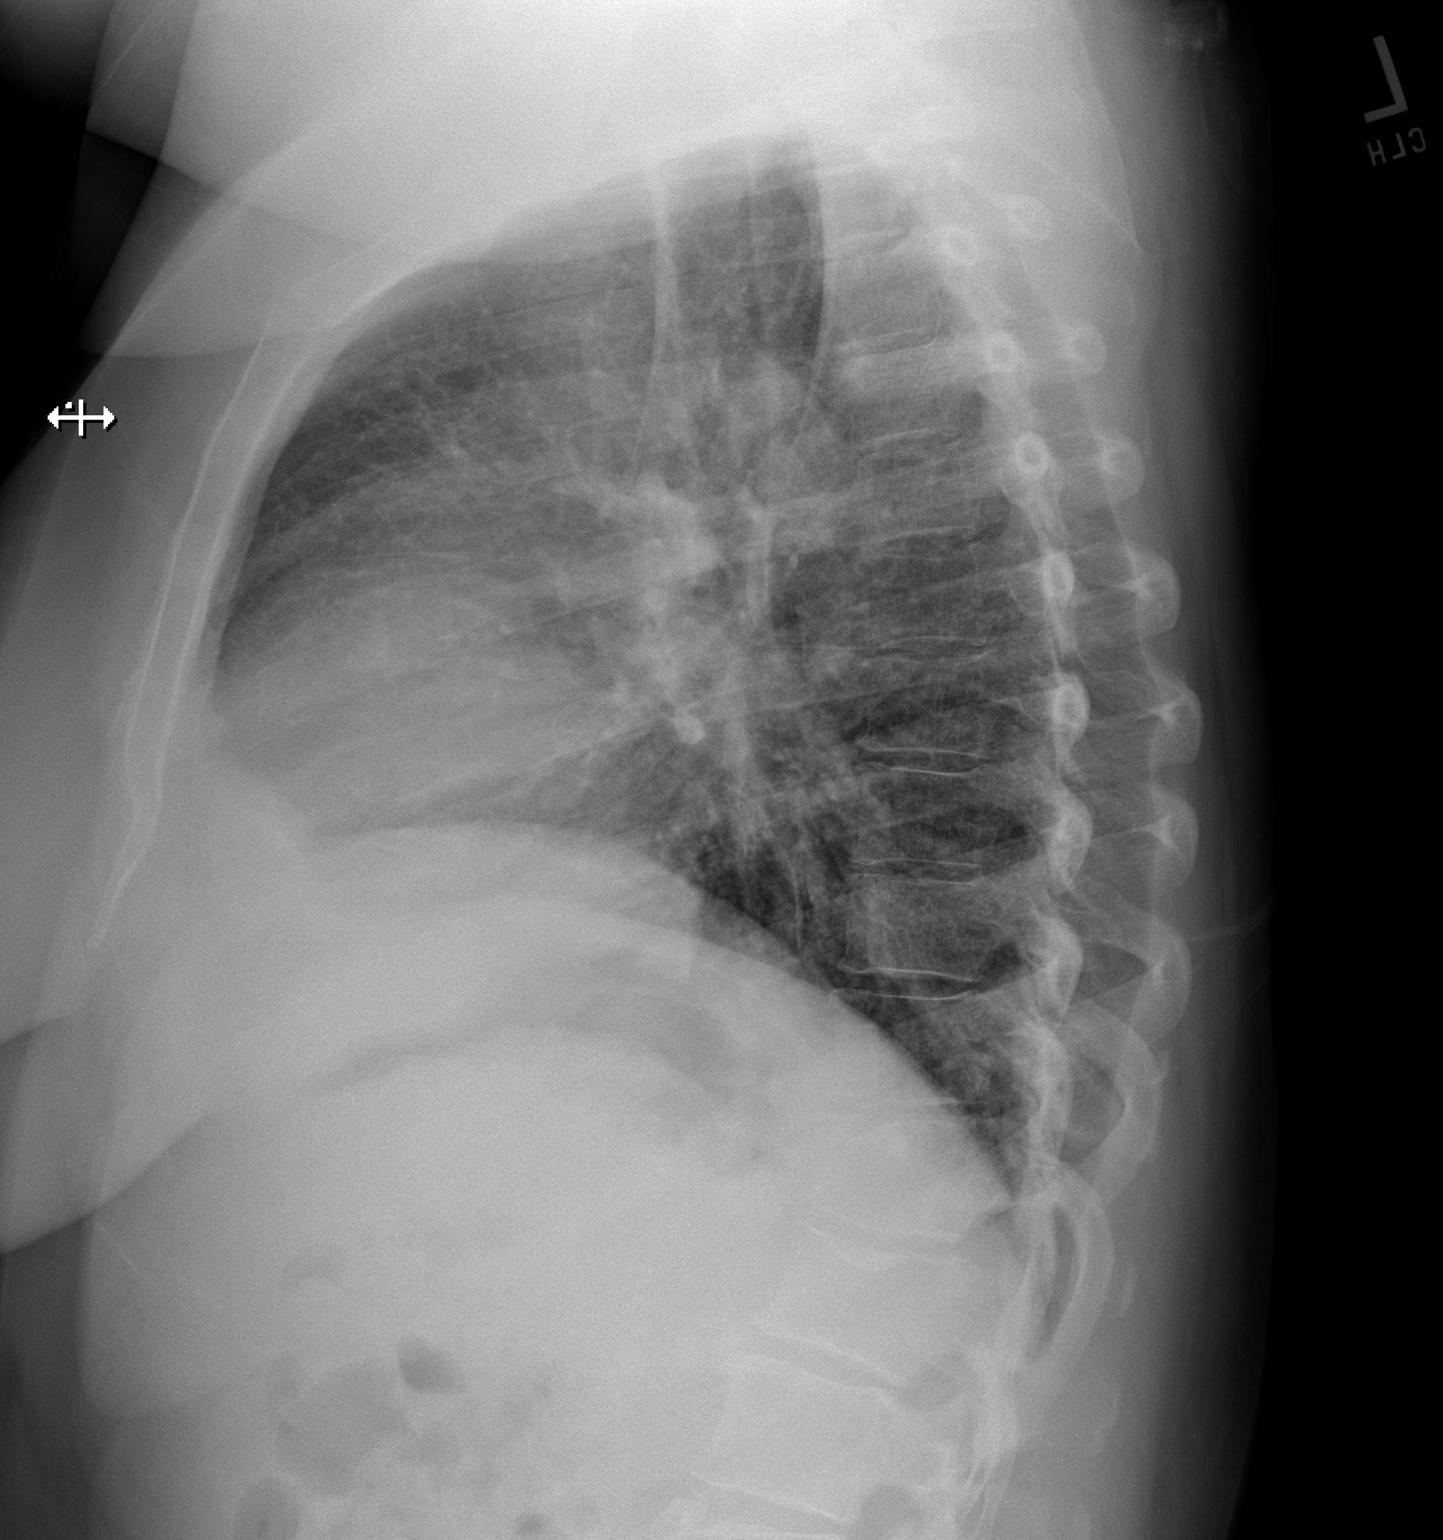

[2 of 2 positions shown; findings below may reference images not displayed]

FINDINGS: The heart size and pulmonary vascularity are normal. The
lungs appear clear and expanded without focal air space disease or
consolidation. No blunting of the costophrenic angles.  No
significant change since previous study.
IMPRESSION: No evidence of active pulmonary disease.

## 2013-08-17 ENCOUNTER — Other Ambulatory Visit: Payer: Self-pay | Admitting: Nurse Practitioner

## 2013-08-17 DIAGNOSIS — Z1231 Encounter for screening mammogram for malignant neoplasm of breast: Secondary | ICD-10-CM

## 2013-08-20 ENCOUNTER — Ambulatory Visit
Admission: RE | Admit: 2013-08-20 | Discharge: 2013-08-20 | Disposition: A | Discharge status: Home / Self Care | Payer: Medicare Other | Source: Ambulatory Visit | Attending: Nurse Practitioner | Admitting: Nurse Practitioner

## 2013-08-20 DIAGNOSIS — Z1231 Encounter for screening mammogram for malignant neoplasm of breast: Secondary | ICD-10-CM

## 2013-08-23 DIAGNOSIS — F3130 Bipolar disorder, current episode depressed, mild or moderate severity, unspecified: Secondary | ICD-10-CM | POA: Diagnosis not present

## 2013-08-23 DIAGNOSIS — Z0000 Encounter for general adult medical examination without abnormal findings: Secondary | ICD-10-CM | POA: Diagnosis not present

## 2013-08-23 DIAGNOSIS — Z0110 Encounter for examination of ears and hearing without abnormal findings: Secondary | ICD-10-CM | POA: Diagnosis not present

## 2013-08-23 DIAGNOSIS — Z113 Encounter for screening for infections with a predominantly sexual mode of transmission: Secondary | ICD-10-CM | POA: Diagnosis not present

## 2013-08-23 DIAGNOSIS — R21 Rash and other nonspecific skin eruption: Secondary | ICD-10-CM | POA: Diagnosis not present

## 2013-08-23 DIAGNOSIS — F17200 Nicotine dependence, unspecified, uncomplicated: Secondary | ICD-10-CM | POA: Diagnosis not present

## 2013-08-26 ENCOUNTER — Other Ambulatory Visit: Payer: Self-pay | Admitting: Nurse Practitioner

## 2013-08-26 DIAGNOSIS — R928 Other abnormal and inconclusive findings on diagnostic imaging of breast: Secondary | ICD-10-CM

## 2013-08-27 DIAGNOSIS — N898 Other specified noninflammatory disorders of vagina: Secondary | ICD-10-CM | POA: Diagnosis not present

## 2013-08-27 DIAGNOSIS — R7301 Impaired fasting glucose: Secondary | ICD-10-CM | POA: Diagnosis not present

## 2013-08-27 DIAGNOSIS — E785 Hyperlipidemia, unspecified: Secondary | ICD-10-CM | POA: Diagnosis not present

## 2013-08-27 DIAGNOSIS — B182 Chronic viral hepatitis C: Secondary | ICD-10-CM | POA: Diagnosis not present

## 2013-08-27 DIAGNOSIS — R319 Hematuria, unspecified: Secondary | ICD-10-CM | POA: Diagnosis not present

## 2013-08-27 DIAGNOSIS — F3130 Bipolar disorder, current episode depressed, mild or moderate severity, unspecified: Secondary | ICD-10-CM | POA: Diagnosis not present

## 2013-08-27 DIAGNOSIS — E559 Vitamin D deficiency, unspecified: Secondary | ICD-10-CM | POA: Diagnosis not present

## 2013-08-27 DIAGNOSIS — Z124 Encounter for screening for malignant neoplasm of cervix: Secondary | ICD-10-CM | POA: Diagnosis not present

## 2013-08-30 ENCOUNTER — Other Ambulatory Visit: Payer: Self-pay | Admitting: Nurse Practitioner

## 2013-08-30 DIAGNOSIS — R928 Other abnormal and inconclusive findings on diagnostic imaging of breast: Secondary | ICD-10-CM

## 2013-09-06 DIAGNOSIS — E559 Vitamin D deficiency, unspecified: Secondary | ICD-10-CM | POA: Diagnosis not present

## 2013-09-06 DIAGNOSIS — N898 Other specified noninflammatory disorders of vagina: Secondary | ICD-10-CM | POA: Diagnosis not present

## 2013-09-06 DIAGNOSIS — R7301 Impaired fasting glucose: Secondary | ICD-10-CM | POA: Diagnosis not present

## 2013-09-06 DIAGNOSIS — B182 Chronic viral hepatitis C: Secondary | ICD-10-CM | POA: Diagnosis not present

## 2013-09-06 DIAGNOSIS — Z124 Encounter for screening for malignant neoplasm of cervix: Secondary | ICD-10-CM | POA: Diagnosis not present

## 2013-09-06 DIAGNOSIS — E785 Hyperlipidemia, unspecified: Secondary | ICD-10-CM | POA: Diagnosis not present

## 2013-09-06 DIAGNOSIS — R319 Hematuria, unspecified: Secondary | ICD-10-CM | POA: Diagnosis not present

## 2013-09-06 DIAGNOSIS — F3130 Bipolar disorder, current episode depressed, mild or moderate severity, unspecified: Secondary | ICD-10-CM | POA: Diagnosis not present

## 2013-09-08 ENCOUNTER — Ambulatory Visit
Admission: RE | Admit: 2013-09-08 | Discharge: 2013-09-08 | Disposition: A | Discharge status: Home / Self Care | Payer: Medicare Other | Source: Ambulatory Visit | Attending: Nurse Practitioner | Admitting: Nurse Practitioner

## 2013-09-08 DIAGNOSIS — R928 Other abnormal and inconclusive findings on diagnostic imaging of breast: Secondary | ICD-10-CM

## 2013-09-08 DIAGNOSIS — N6019 Diffuse cystic mastopathy of unspecified breast: Secondary | ICD-10-CM | POA: Diagnosis not present

## 2013-10-15 DIAGNOSIS — R319 Hematuria, unspecified: Secondary | ICD-10-CM | POA: Diagnosis not present

## 2013-10-15 DIAGNOSIS — E559 Vitamin D deficiency, unspecified: Secondary | ICD-10-CM | POA: Diagnosis not present

## 2013-10-15 DIAGNOSIS — R7301 Impaired fasting glucose: Secondary | ICD-10-CM | POA: Diagnosis not present

## 2013-10-15 DIAGNOSIS — E669 Obesity, unspecified: Secondary | ICD-10-CM | POA: Diagnosis not present

## 2013-10-15 DIAGNOSIS — F3130 Bipolar disorder, current episode depressed, mild or moderate severity, unspecified: Secondary | ICD-10-CM | POA: Diagnosis not present

## 2013-10-15 DIAGNOSIS — B182 Chronic viral hepatitis C: Secondary | ICD-10-CM | POA: Diagnosis not present

## 2013-10-15 DIAGNOSIS — E785 Hyperlipidemia, unspecified: Secondary | ICD-10-CM | POA: Diagnosis not present

## 2013-11-15 DIAGNOSIS — R319 Hematuria, unspecified: Secondary | ICD-10-CM | POA: Diagnosis not present

## 2013-11-15 DIAGNOSIS — R059 Cough, unspecified: Secondary | ICD-10-CM | POA: Diagnosis not present

## 2013-11-15 DIAGNOSIS — E559 Vitamin D deficiency, unspecified: Secondary | ICD-10-CM | POA: Diagnosis not present

## 2013-11-15 DIAGNOSIS — R7301 Impaired fasting glucose: Secondary | ICD-10-CM | POA: Diagnosis not present

## 2013-11-15 DIAGNOSIS — E785 Hyperlipidemia, unspecified: Secondary | ICD-10-CM | POA: Diagnosis not present

## 2013-11-15 DIAGNOSIS — B182 Chronic viral hepatitis C: Secondary | ICD-10-CM | POA: Diagnosis not present

## 2013-11-15 DIAGNOSIS — F3130 Bipolar disorder, current episode depressed, mild or moderate severity, unspecified: Secondary | ICD-10-CM | POA: Diagnosis not present

## 2013-11-15 DIAGNOSIS — E669 Obesity, unspecified: Secondary | ICD-10-CM | POA: Diagnosis not present

## 2014-01-22 ENCOUNTER — Encounter (HOSPITAL_COMMUNITY): Payer: Self-pay | Admitting: Emergency Medicine

## 2014-01-22 ENCOUNTER — Emergency Department (HOSPITAL_COMMUNITY)
Admission: EM | Admit: 2014-01-22 | Discharge: 2014-01-22 | Disposition: A | Discharge status: Home / Self Care | Payer: Medicare Other | Attending: Emergency Medicine | Admitting: Emergency Medicine

## 2014-01-22 DIAGNOSIS — S30860A Insect bite (nonvenomous) of lower back and pelvis, initial encounter: Secondary | ICD-10-CM | POA: Diagnosis present

## 2014-01-22 DIAGNOSIS — Z8619 Personal history of other infectious and parasitic diseases: Secondary | ICD-10-CM | POA: Insufficient documentation

## 2014-01-22 DIAGNOSIS — R5381 Other malaise: Secondary | ICD-10-CM | POA: Diagnosis not present

## 2014-01-22 DIAGNOSIS — Y939 Activity, unspecified: Secondary | ICD-10-CM | POA: Diagnosis not present

## 2014-01-22 DIAGNOSIS — S20169A Insect bite (nonvenomous) of breast, unspecified breast, initial encounter: Principal | ICD-10-CM

## 2014-01-22 DIAGNOSIS — Z8719 Personal history of other diseases of the digestive system: Secondary | ICD-10-CM | POA: Insufficient documentation

## 2014-01-22 DIAGNOSIS — W57XXXA Bitten or stung by nonvenomous insect and other nonvenomous arthropods, initial encounter: Principal | ICD-10-CM

## 2014-01-22 DIAGNOSIS — L089 Local infection of the skin and subcutaneous tissue, unspecified: Secondary | ICD-10-CM | POA: Diagnosis not present

## 2014-01-22 DIAGNOSIS — Y929 Unspecified place or not applicable: Secondary | ICD-10-CM | POA: Insufficient documentation

## 2014-01-22 DIAGNOSIS — R5383 Other fatigue: Secondary | ICD-10-CM

## 2014-01-22 DIAGNOSIS — R110 Nausea: Secondary | ICD-10-CM | POA: Diagnosis not present

## 2014-01-22 DIAGNOSIS — Z79899 Other long term (current) drug therapy: Secondary | ICD-10-CM | POA: Diagnosis not present

## 2014-01-22 DIAGNOSIS — F319 Bipolar disorder, unspecified: Secondary | ICD-10-CM | POA: Insufficient documentation

## 2014-01-22 DIAGNOSIS — F17200 Nicotine dependence, unspecified, uncomplicated: Secondary | ICD-10-CM | POA: Insufficient documentation

## 2014-01-22 DIAGNOSIS — S30861A Insect bite (nonvenomous) of abdominal wall, initial encounter: Secondary | ICD-10-CM

## 2014-01-22 MED ORDER — CEPHALEXIN 500 MG PO CAPS: 500.0000 mg | ORAL_CAPSULE | Freq: Four times a day (QID) | ORAL | Status: DC

## 2014-01-22 MED ORDER — TRAMADOL HCL 50 MG PO TABS: 50.0000 mg | ORAL_TABLET | Freq: Four times a day (QID) | ORAL | Status: DC | PRN

## 2014-01-22 NOTE — ED Provider Notes (Signed)
CSN: [id number]     Arrival date & time 01/22/14  1612 History  This chart was scribed for non-physician practitioner working with Ovid Curd R. Alvino Chapel, MD, by Erling Conte, ED Scribe. This patient was seen in room WTR8/WTR8 and the patient's care was started at 4:52 PM.    Chief Complaint  Patient presents with  . Insect Bite      The history is provided by the patient. No language interpreter was used.   HPI Comments: Patricia Hall is a 48 y.o. female with a h/o Bipolar 1 disorder, substance abuse, cholecystitis, and chronic Hep C w/ coma, who presents to the Emergency Department complaining of a gradually worsening insect bite that occurred 1 day ago. Patient states that the bite occurred on the RLQ of abdomen. She states she was laying out yesterday and she felt a sting but did not see an insect. Patient states she is having some associated redness, 10/10 "throbbing and stinging" abdominal pain and swelling to the area along with fatigue and nausea. Patient states she took 3 Benadryl with mild relief. She admits she has had abscesses and boils before. She denies any fever or emesis.    Past Medical History  Diagnosis Date  . Hep C w/ coma, chronic   . Bipolar 1 disorder   . Substance abuse   . Cholecystitis 05/12/2012   Past Surgical History  Procedure Laterality Date  . Appendectomy    . Eye surgery    . Cholecystectomy  05/12/2012    Procedure: LAPAROSCOPIC CHOLECYSTECTOMY WITH INTRAOPERATIVE CHOLANGIOGRAM;  Surgeon: Ralene Ok, MD;  Location: [facility];  Service: General;  Laterality: N/A;   History reviewed. No pertinent family history. History  Substance Use Topics  . Smoking status: Current Every Day Smoker -- 0.50 packs/day for 25 years    Types: Cigarettes  . Smokeless tobacco: Never Used  . Alcohol Use: No   OB History   Grav Para Term Preterm Abortions TAB SAB Ect Mult Living                 Review of Systems  Constitutional: Positive for fatigue.  Negative for fever.  Gastrointestinal: Positive for nausea and abdominal pain. Negative for vomiting.  Skin: Positive for color change (redness and swelling to bite site) and wound ("insect bite" to RLQ).  All other systems reviewed and are negative.     Allergies  Review of patient's allergies indicates no known allergies.  Home Medications   Prior to Admission medications   Medication Sig Start Date End Date Taking? Authorizing Provider  cephALEXin (KEFLEX) 500 MG capsule Take 1 capsule (500 mg total) by mouth 4 (four) times daily. 04/01/13   Montine Circle, PA-C  cephALEXin (KEFLEX) 500 MG capsule Take 1 capsule (500 mg total) by mouth 4 (four) times daily. 01/22/14   Noland Fordyce, PA-C  hydrOXYzine (ATARAX/VISTARIL) 25 MG tablet Take 25 mg by mouth 3 (three) times daily as needed. For anxiety    Historical Provider, MD  ibuprofen (ADVIL,MOTRIN) 200 MG tablet Take 800 mg by mouth every 6 (six) hours as needed for pain.    Historical Provider, MD  lamoTRIgine (LAMICTAL) 25 MG tablet Take 50 mg by mouth 2 (two) times daily.    Historical Provider, MD  traMADol (ULTRAM) 50 MG tablet Take 1 tablet (50 mg total) by mouth every 6 (six) hours as needed. 01/22/14   Noland Fordyce, PA-C   Triage Vitals: BP 128/68  Pulse 90  Temp(Src) 98 F (36.7 C) (Oral)  Resp 16  Ht 5' 0.5" (1.537 m)  Wt 190 lb (86.183 kg)  BMI 36.48 kg/m2  SpO2 97%  LMP 12/23/2013  Physical Exam  Nursing note and vitals reviewed. Constitutional: She is oriented to person, place, and time. She appears well-developed and well-nourished.  HENT:  Head: Normocephalic and atraumatic.  Eyes: EOM are normal.  Neck: Normal range of motion.  Cardiovascular: Normal rate.   Pulmonary/Chest: Effort normal.  Abdominal: Soft. Bowel sounds are normal. There is tenderness.  Obese abdomen. 5 by 6 cm area of erythema, warmth and tenderness to right panus. Centralized pinpoint puncture wound, no active bleeding or discharge. No  induration or fluctuance. Characteristic of cellulitis.   Musculoskeletal: Normal range of motion.  Neurological: She is alert and oriented to person, place, and time.  Skin: Skin is warm and dry.  Psychiatric: She has a normal mood and affect. Her behavior is normal.    ED Course  Procedures (including critical care time)  DIAGNOSTIC STUDIES: Oxygen Saturation is 97% on RA, normal by my interpretation.    COORDINATION OF CARE: 5:09 PM- Will discharge and order Keflex and Ultram. Pt advised of plan for treatment and pt agrees.   Labs Review Labs Reviewed - No data to display  Imaging Review No results found.   EKG Interpretation None      MDM   Final diagnoses:  Insect bite of abdomen, infected, initial encounter    Pt is a 48yo female c/o possible insect bite to lower abdomen yesterday, gradually worsening pain and redness.  Pt appears uncomfortable but non-toxic. Afebrile. Exam consistent with cellulitis.  No abscess present for I&D at this time. Will tx with keflex and advised pt to alternate hot and cold compresses to help with pain. Advised to f/u with PCP in 3 days for wound recheck. Return precautions provided. Pt verbalized understanding and agreement with tx plan.   I personally performed the services described in this documentation, which was scribed in my presence. The recorded information has been reviewed and is accurate.     Noland Fordyce, PA-C 01/22/14 1844

## 2014-01-22 NOTE — ED Notes (Signed)
Pt states she was laying out yesterday and felt a "sting" to RLQ of abdomen.  States the area was a little red but woke up today the redness has gotten much bigger and painful. Denies fever but feels tired and has pain to the area.

## 2014-01-23 NOTE — ED Provider Notes (Signed)
Medical screening examination/treatment/procedure(s) were performed by non-physician practitioner and as supervising physician I was immediately available for consultation/collaboration.   EKG Interpretation None       Jasper Riling. Alvino Chapel, MD 01/23/14 0211

## 2014-01-27 DIAGNOSIS — F3130 Bipolar disorder, current episode depressed, mild or moderate severity, unspecified: Secondary | ICD-10-CM | POA: Diagnosis not present

## 2014-01-27 DIAGNOSIS — E669 Obesity, unspecified: Secondary | ICD-10-CM | POA: Diagnosis not present

## 2014-01-27 DIAGNOSIS — E559 Vitamin D deficiency, unspecified: Secondary | ICD-10-CM | POA: Diagnosis not present

## 2014-01-27 DIAGNOSIS — E785 Hyperlipidemia, unspecified: Secondary | ICD-10-CM | POA: Diagnosis not present

## 2014-01-27 DIAGNOSIS — R7301 Impaired fasting glucose: Secondary | ICD-10-CM | POA: Diagnosis not present

## 2014-01-27 DIAGNOSIS — B182 Chronic viral hepatitis C: Secondary | ICD-10-CM | POA: Diagnosis not present

## 2014-01-27 DIAGNOSIS — K122 Cellulitis and abscess of mouth: Secondary | ICD-10-CM | POA: Diagnosis not present

## 2014-03-01 DIAGNOSIS — F3132 Bipolar disorder, current episode depressed, moderate: Secondary | ICD-10-CM | POA: Diagnosis not present

## 2014-03-08 DIAGNOSIS — F3132 Bipolar disorder, current episode depressed, moderate: Secondary | ICD-10-CM | POA: Diagnosis not present

## 2014-04-04 DIAGNOSIS — F3132 Bipolar disorder, current episode depressed, moderate: Secondary | ICD-10-CM | POA: Diagnosis not present

## 2014-05-10 DIAGNOSIS — Z5181 Encounter for therapeutic drug level monitoring: Secondary | ICD-10-CM | POA: Diagnosis not present

## 2014-05-10 DIAGNOSIS — Z720 Tobacco use: Secondary | ICD-10-CM | POA: Diagnosis not present

## 2014-05-10 DIAGNOSIS — Z79899 Other long term (current) drug therapy: Secondary | ICD-10-CM | POA: Diagnosis not present

## 2014-05-10 DIAGNOSIS — Z23 Encounter for immunization: Secondary | ICD-10-CM | POA: Diagnosis not present

## 2014-05-10 DIAGNOSIS — E559 Vitamin D deficiency, unspecified: Secondary | ICD-10-CM | POA: Diagnosis not present

## 2014-05-10 DIAGNOSIS — D649 Anemia, unspecified: Secondary | ICD-10-CM | POA: Diagnosis not present

## 2014-05-10 DIAGNOSIS — B1920 Unspecified viral hepatitis C without hepatic coma: Secondary | ICD-10-CM | POA: Diagnosis not present

## 2014-05-10 DIAGNOSIS — E785 Hyperlipidemia, unspecified: Secondary | ICD-10-CM | POA: Diagnosis not present

## 2014-05-10 DIAGNOSIS — R7301 Impaired fasting glucose: Secondary | ICD-10-CM | POA: Diagnosis not present

## 2014-05-10 DIAGNOSIS — R0600 Dyspnea, unspecified: Secondary | ICD-10-CM | POA: Diagnosis not present

## 2014-05-10 DIAGNOSIS — F3132 Bipolar disorder, current episode depressed, moderate: Secondary | ICD-10-CM | POA: Diagnosis not present

## 2014-05-10 DIAGNOSIS — F3130 Bipolar disorder, current episode depressed, mild or moderate severity, unspecified: Secondary | ICD-10-CM | POA: Diagnosis not present

## 2014-05-19 DIAGNOSIS — F3132 Bipolar disorder, current episode depressed, moderate: Secondary | ICD-10-CM | POA: Diagnosis not present

## 2014-05-23 DIAGNOSIS — F3132 Bipolar disorder, current episode depressed, moderate: Secondary | ICD-10-CM | POA: Diagnosis not present

## 2014-05-25 IMAGING — RF DG CHOLANGIOGRAM OPERATIVE
1 series · 8 of 8 positions shown · non-contrast
Comparison: None

CLINICAL DATA: Right upper abdominal pain

INTRAOPERATIVE CHOLANGIOGRAM
TECHNIQUE: Cholangiographic images from the C-arm fluoroscopic
device were submitted for interpretation post-operatively.  Please
see the procedural report for the amount of contrast and the
fluoroscopy time utilized.

[Series 1: run · 2 acquisitions, 8 frames shown]
[im 1/2]
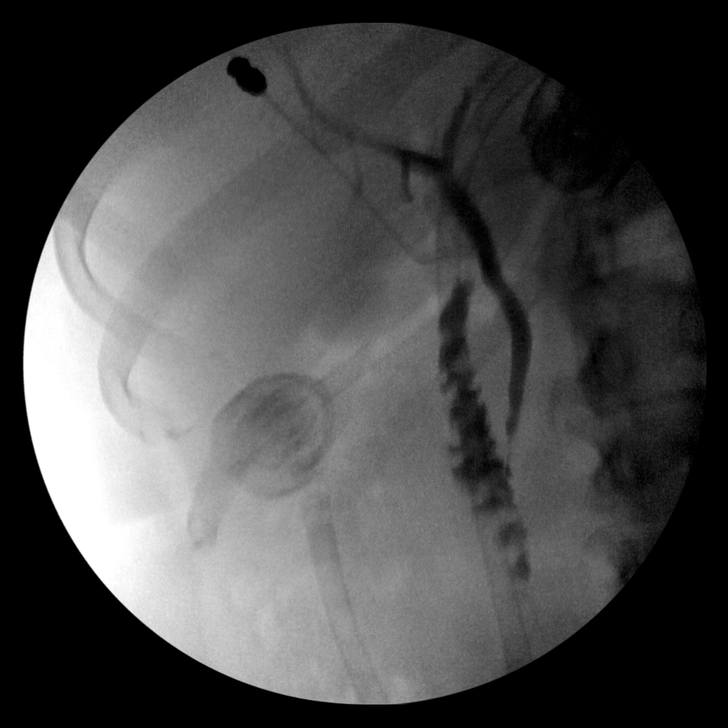
[im 1/2]
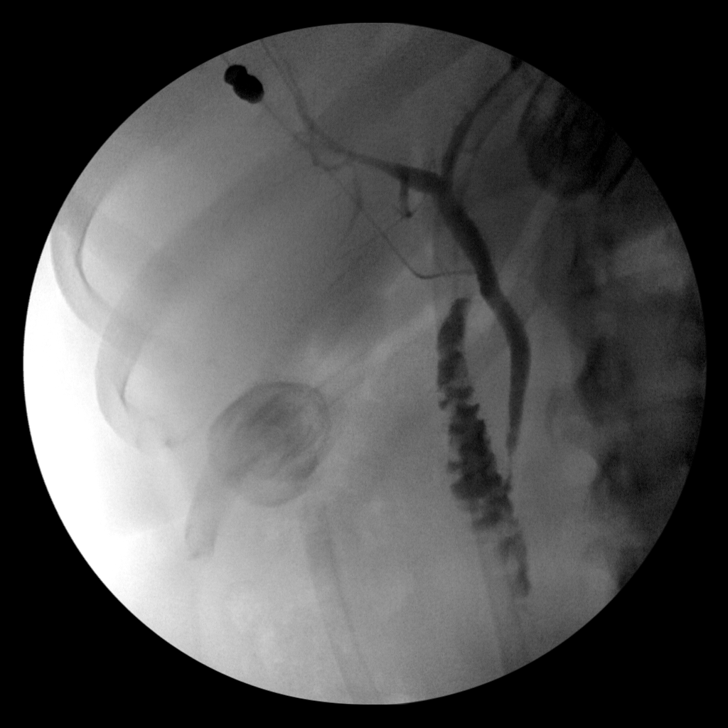
[im 1/2]
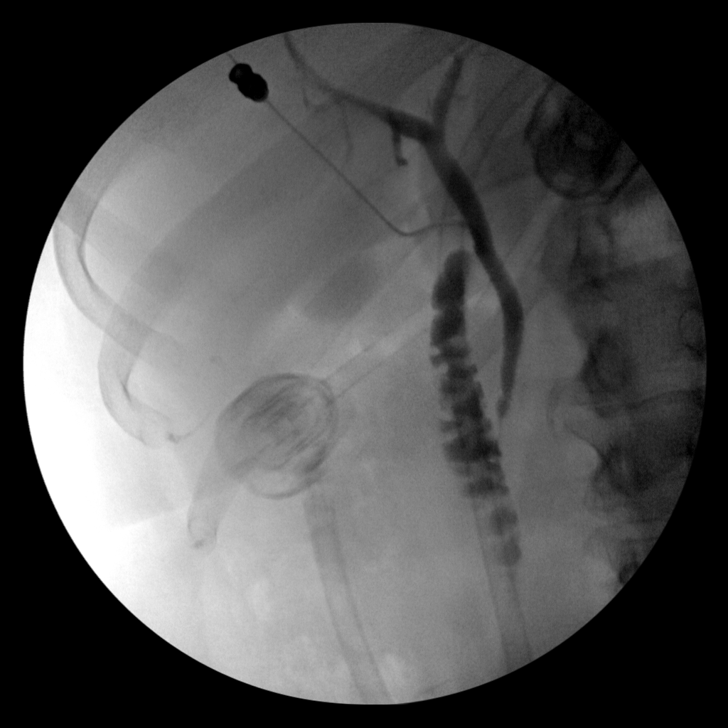
[im 1/2]
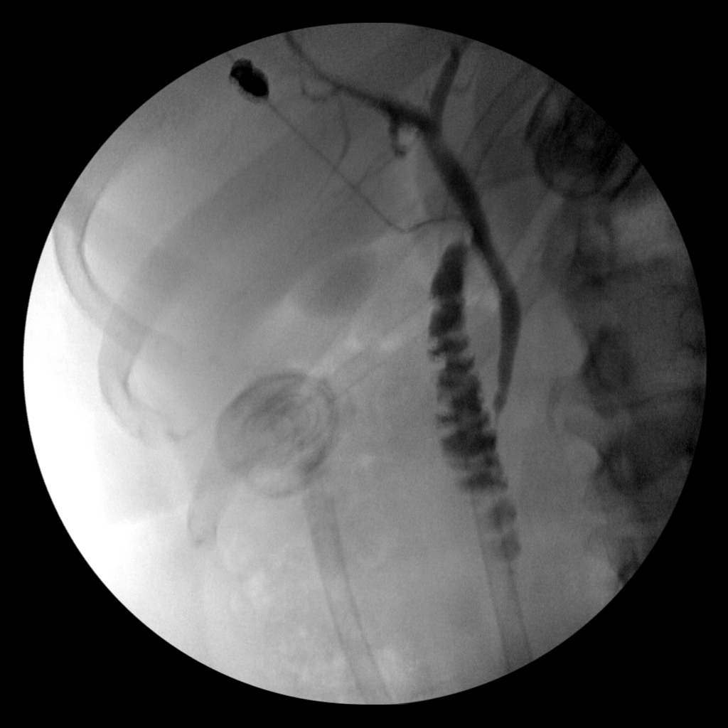
[im 2/2]
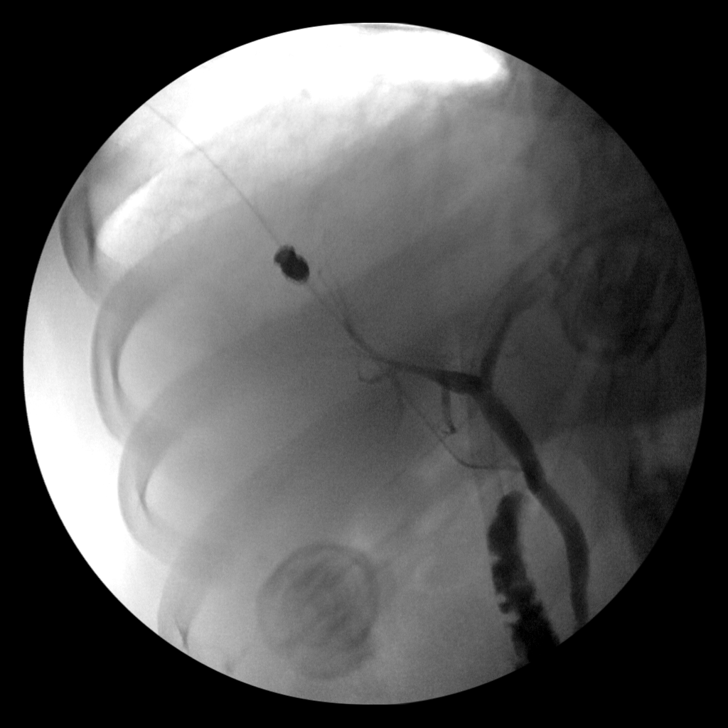
[im 2/2]
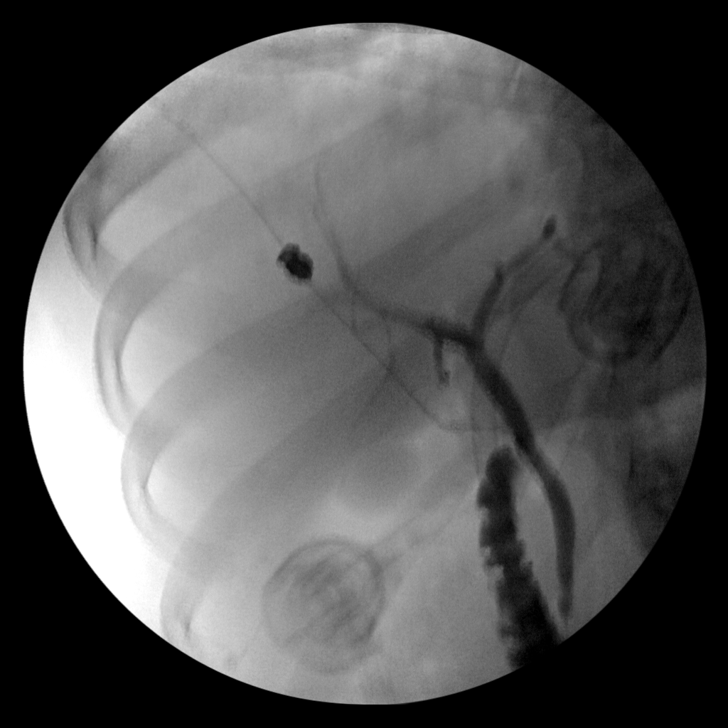
[im 2/2]
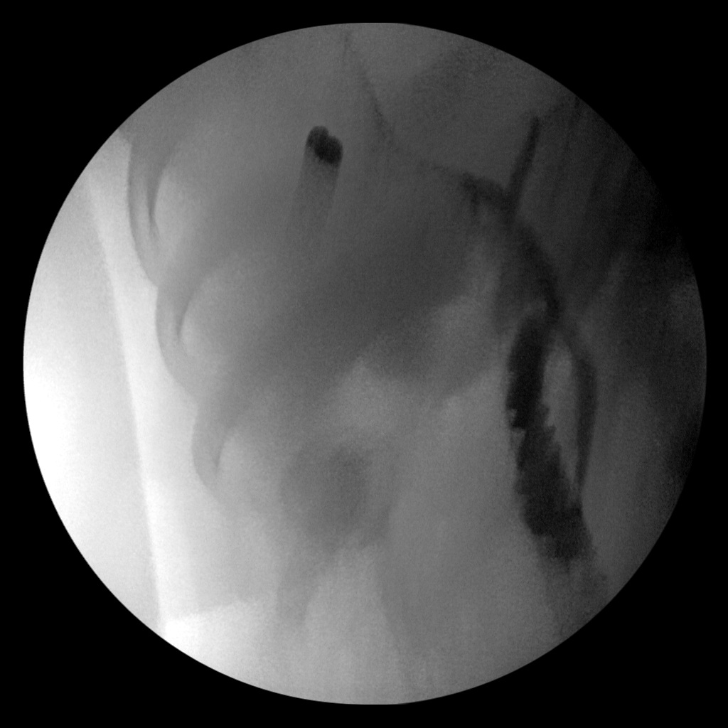
[im 2/2]
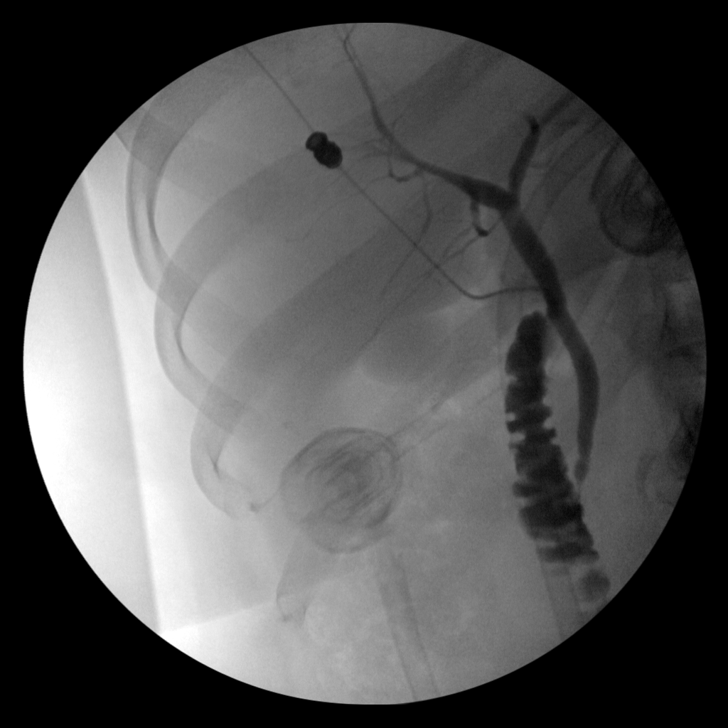

[8 of 8 positions shown; findings below may reference images not displayed]

FINDINGS: No persistent filling defects in the common duct.
Intrahepatic ducts are incompletely visualized, appearing
decompressed centrally. Contrast passes into the duodenum.

IMPRESSION

Negative for retained common duct stone.

## 2014-06-23 DIAGNOSIS — F3130 Bipolar disorder, current episode depressed, mild or moderate severity, unspecified: Secondary | ICD-10-CM | POA: Diagnosis not present

## 2014-06-23 DIAGNOSIS — E785 Hyperlipidemia, unspecified: Secondary | ICD-10-CM | POA: Diagnosis not present

## 2014-06-23 DIAGNOSIS — E559 Vitamin D deficiency, unspecified: Secondary | ICD-10-CM | POA: Diagnosis not present

## 2014-06-23 DIAGNOSIS — F419 Anxiety disorder, unspecified: Secondary | ICD-10-CM | POA: Diagnosis not present

## 2014-06-23 DIAGNOSIS — E875 Hyperkalemia: Secondary | ICD-10-CM | POA: Diagnosis not present

## 2014-06-23 DIAGNOSIS — R7301 Impaired fasting glucose: Secondary | ICD-10-CM | POA: Diagnosis not present

## 2014-06-23 DIAGNOSIS — Z720 Tobacco use: Secondary | ICD-10-CM | POA: Diagnosis not present

## 2014-06-23 DIAGNOSIS — B1920 Unspecified viral hepatitis C without hepatic coma: Secondary | ICD-10-CM | POA: Diagnosis not present

## 2014-06-29 DIAGNOSIS — R7301 Impaired fasting glucose: Secondary | ICD-10-CM | POA: Diagnosis not present

## 2014-06-29 DIAGNOSIS — E785 Hyperlipidemia, unspecified: Secondary | ICD-10-CM | POA: Diagnosis not present

## 2014-06-29 DIAGNOSIS — R252 Cramp and spasm: Secondary | ICD-10-CM | POA: Diagnosis not present

## 2014-06-29 DIAGNOSIS — B1920 Unspecified viral hepatitis C without hepatic coma: Secondary | ICD-10-CM | POA: Diagnosis not present

## 2014-06-29 DIAGNOSIS — E559 Vitamin D deficiency, unspecified: Secondary | ICD-10-CM | POA: Diagnosis not present

## 2014-06-29 DIAGNOSIS — R0602 Shortness of breath: Secondary | ICD-10-CM | POA: Diagnosis not present

## 2014-06-29 DIAGNOSIS — Z720 Tobacco use: Secondary | ICD-10-CM | POA: Diagnosis not present

## 2014-06-29 DIAGNOSIS — F3130 Bipolar disorder, current episode depressed, mild or moderate severity, unspecified: Secondary | ICD-10-CM | POA: Diagnosis not present

## 2014-06-29 DIAGNOSIS — N390 Urinary tract infection, site not specified: Secondary | ICD-10-CM | POA: Diagnosis not present

## 2014-06-30 DIAGNOSIS — Z0000 Encounter for general adult medical examination without abnormal findings: Secondary | ICD-10-CM | POA: Diagnosis not present

## 2014-06-30 DIAGNOSIS — N390 Urinary tract infection, site not specified: Secondary | ICD-10-CM | POA: Diagnosis not present

## 2014-06-30 DIAGNOSIS — I10 Essential (primary) hypertension: Secondary | ICD-10-CM | POA: Diagnosis not present

## 2014-07-22 DIAGNOSIS — Z720 Tobacco use: Secondary | ICD-10-CM | POA: Diagnosis not present

## 2014-07-22 DIAGNOSIS — R635 Abnormal weight gain: Secondary | ICD-10-CM | POA: Diagnosis not present

## 2014-07-22 DIAGNOSIS — B1920 Unspecified viral hepatitis C without hepatic coma: Secondary | ICD-10-CM | POA: Diagnosis not present

## 2014-07-22 DIAGNOSIS — E559 Vitamin D deficiency, unspecified: Secondary | ICD-10-CM | POA: Diagnosis not present

## 2014-07-22 DIAGNOSIS — R252 Cramp and spasm: Secondary | ICD-10-CM | POA: Diagnosis not present

## 2014-07-22 DIAGNOSIS — R7301 Impaired fasting glucose: Secondary | ICD-10-CM | POA: Diagnosis not present

## 2014-07-22 DIAGNOSIS — E785 Hyperlipidemia, unspecified: Secondary | ICD-10-CM | POA: Diagnosis not present

## 2014-07-22 DIAGNOSIS — F3130 Bipolar disorder, current episode depressed, mild or moderate severity, unspecified: Secondary | ICD-10-CM | POA: Diagnosis not present

## 2014-08-04 DIAGNOSIS — F419 Anxiety disorder, unspecified: Secondary | ICD-10-CM | POA: Diagnosis not present

## 2014-08-04 DIAGNOSIS — F319 Bipolar disorder, unspecified: Secondary | ICD-10-CM | POA: Diagnosis not present

## 2014-08-11 DIAGNOSIS — E559 Vitamin D deficiency, unspecified: Secondary | ICD-10-CM | POA: Diagnosis not present

## 2014-08-11 DIAGNOSIS — Z720 Tobacco use: Secondary | ICD-10-CM | POA: Diagnosis not present

## 2014-08-11 DIAGNOSIS — B1920 Unspecified viral hepatitis C without hepatic coma: Secondary | ICD-10-CM | POA: Diagnosis not present

## 2014-08-11 DIAGNOSIS — F419 Anxiety disorder, unspecified: Secondary | ICD-10-CM | POA: Diagnosis not present

## 2014-08-11 DIAGNOSIS — F3130 Bipolar disorder, current episode depressed, mild or moderate severity, unspecified: Secondary | ICD-10-CM | POA: Diagnosis not present

## 2014-08-11 DIAGNOSIS — R7309 Other abnormal glucose: Secondary | ICD-10-CM | POA: Diagnosis not present

## 2014-08-11 DIAGNOSIS — E785 Hyperlipidemia, unspecified: Secondary | ICD-10-CM | POA: Diagnosis not present

## 2014-08-11 DIAGNOSIS — G629 Polyneuropathy, unspecified: Secondary | ICD-10-CM | POA: Diagnosis not present

## 2014-08-22 DIAGNOSIS — E559 Vitamin D deficiency, unspecified: Secondary | ICD-10-CM | POA: Diagnosis not present

## 2014-08-22 DIAGNOSIS — E785 Hyperlipidemia, unspecified: Secondary | ICD-10-CM | POA: Diagnosis not present

## 2014-08-22 DIAGNOSIS — G629 Polyneuropathy, unspecified: Secondary | ICD-10-CM | POA: Diagnosis not present

## 2014-08-22 DIAGNOSIS — F419 Anxiety disorder, unspecified: Secondary | ICD-10-CM | POA: Diagnosis not present

## 2014-08-22 DIAGNOSIS — B1920 Unspecified viral hepatitis C without hepatic coma: Secondary | ICD-10-CM | POA: Diagnosis not present

## 2014-08-22 DIAGNOSIS — Z720 Tobacco use: Secondary | ICD-10-CM | POA: Diagnosis not present

## 2014-08-22 DIAGNOSIS — F3130 Bipolar disorder, current episode depressed, mild or moderate severity, unspecified: Secondary | ICD-10-CM | POA: Diagnosis not present

## 2014-08-22 DIAGNOSIS — R7309 Other abnormal glucose: Secondary | ICD-10-CM | POA: Diagnosis not present

## 2014-09-01 ENCOUNTER — Encounter: Payer: Self-pay | Admitting: Neurology

## 2014-09-01 ENCOUNTER — Ambulatory Visit (INDEPENDENT_AMBULATORY_CARE_PROVIDER_SITE_OTHER): Payer: Medicare Other | Admitting: Neurology

## 2014-09-01 VITALS — BP 101/64 | HR 74 | Ht 61.0 in | Wt 214.2 lb

## 2014-09-01 DIAGNOSIS — R202 Paresthesia of skin: Secondary | ICD-10-CM | POA: Insufficient documentation

## 2014-09-01 DIAGNOSIS — R519 Headache, unspecified: Secondary | ICD-10-CM

## 2014-09-01 DIAGNOSIS — R209 Unspecified disturbances of skin sensation: Secondary | ICD-10-CM | POA: Diagnosis not present

## 2014-09-01 DIAGNOSIS — R51 Headache: Secondary | ICD-10-CM

## 2014-09-01 DIAGNOSIS — E538 Deficiency of other specified B group vitamins: Secondary | ICD-10-CM | POA: Diagnosis not present

## 2014-09-01 HISTORY — DX: Headache, unspecified: R51.9

## 2014-09-01 HISTORY — DX: Paresthesia of skin: R20.2

## 2014-09-01 NOTE — Progress Notes (Signed)
Reason for visit: Paresthesias  Patricia Hall is a 49 y.o. female  History of present illness:  Patricia Hall is a 50 year old left-handed white female with a history of bipolar disorder. The patient has prediabetes, and obesity. She indicates that she has had carpal tunnel syndrome with numbness in both hands for number of years. She is a poor historian, but it appears that she has had some discomfort in the legs for quite some time, she was on gabapentin for this. Within the last 2 weeks, she has had some increased paresthesias and dysesthesias in the legs. She has pain with walking, the feet feel better when she is off of her feet. She reports a burning sensation. She denies any back pain or neck discomfort. She has had some chronic urinary incontinence. She denies any true weakness of the legs. She has not had any falls, or any significant balance issues. She reports chronic daily headaches that have been present for 3-4 months. The patient was seen by her primary care physician and referred to this office for evaluation of a possible peripheral neuropathy.  Past Medical History  Diagnosis Date  . Hep C w/ coma, chronic   . Bipolar 1 disorder   . Substance abuse   . Cholecystitis 05/12/2012  . Paresthesia 09/01/2014  . Obese   . Hyperlipemia   . Vitamin D deficiency   . Substance abuse     cocaine  . Chronic daily headache 09/01/2014    Past Surgical History  Procedure Laterality Date  . Appendectomy    . Eye surgery      strabismus surgery  . Cholecystectomy  05/12/2012    Procedure: LAPAROSCOPIC CHOLECYSTECTOMY WITH INTRAOPERATIVE CHOLANGIOGRAM;  Surgeon: Ralene Ok, MD;  Location: [facility];  Service: General;  Laterality: N/A;    Family History  Problem Relation Age of Onset  . Heart disease Mother   . Diabetes Mother   . Diabetes Father   . Heart disease Father   . Diabetes Sister   . Diabetes Brother     Social history:  reports that she has been smoking  Cigarettes.  She has a 12.5 pack-year smoking history. She has never used smokeless tobacco. She reports that she does not drink alcohol or use illicit drugs.  Medications:  Current Outpatient Prescriptions on File Prior to Visit  Medication Sig Dispense Refill  . ibuprofen (ADVIL,MOTRIN) 200 MG tablet Take 800 mg by mouth every 6 (six) hours as needed for pain.     No current facility-administered medications on file prior to visit.     No Known Allergies  ROS:  Out of a complete 14 system review of symptoms, the patient complains only of the following symptoms, and all other reviewed systems are negative.  Dysesthesias in the legs, leg pain  Blood pressure 101/64, pulse 74, height 5\' 1"  (1.549 m), weight 214 lb 3.2 oz (97.16 kg).  Physical Exam  General: The patient is alert and cooperative at the time of the examination. The patient is markedly obese.  Eyes: Pupils are equal, round, and reactive to light. Discs are flat bilaterally.  Neck: The neck is supple, no carotid bruits are noted.  Respiratory: The respiratory examination is clear.  Cardiovascular: The cardiovascular examination reveals a regular rate and rhythm, no obvious murmurs or rubs are noted.  Skin: Extremities are without significant edema.  Neurologic Exam  Mental status: The patient is alert and oriented x 3 at the time of the examination. The patient  has apparent normal recent and remote memory, with an apparently normal attention span and concentration ability.  Cranial nerves: Facial symmetry is present. There is good sensation of the face to pinprick and soft touch bilaterally. The strength of the facial muscles and the muscles to head turning and shoulder shrug are normal bilaterally. Speech is well enunciated, no aphasia or dysarthria is noted. Extraocular movements are full. Visual fields are full. The tongue is midline, and the patient has symmetric elevation of the soft palate. No obvious hearing  deficits are noted.  Motor: The motor testing reveals 5 over 5 strength of all 4 extremities. Good symmetric motor tone is noted throughout.  Sensory: Sensory testing is intact to pinprick, soft touch, vibration sensation, and position sense on all 4 extremities. There is no evidence of a stocking pattern pinprick sensory deficit in the legs. No evidence of extinction is noted.  Coordination: Cerebellar testing reveals good finger-nose-finger and heel-to-shin bilaterally.  Gait and station: Gait is normal. Tandem gait is normal. Romberg is negative. No drift is seen.  Reflexes: Deep tendon reflexes are symmetric and normal bilaterally. Toes are downgoing bilaterally.   Assessment/Plan:  1. Paresthesias, lower extremities  2. Prediabetes  3. Chronic daily headache  4. History of hepatitis C  The patient is having new symptoms of lower extremity discomfort that occurs with weightbearing, better when she is off of her feet. The clinical examination shows normal reflexes and strength. There is no clear evidence of a peripheral neuropathy, but a small fiber neuropathy still needs to be considered. The patient is on Cymbalta without benefit. She will be set up for blood work today, and she will have nerve conduction studies done on both legs and one arm, EMG study on both legs. She will follow-up for the EMG evaluation.   Jill Alexanders MD 09/01/2014 8:33 PM  [facility] [address] [place], [place] [address]  Phone [phone] Fax [phone]

## 2014-09-01 NOTE — Patient Instructions (Signed)

## 2014-09-02 DIAGNOSIS — F319 Bipolar disorder, unspecified: Secondary | ICD-10-CM | POA: Diagnosis not present

## 2014-09-05 DIAGNOSIS — E785 Hyperlipidemia, unspecified: Secondary | ICD-10-CM | POA: Diagnosis not present

## 2014-09-05 DIAGNOSIS — M2550 Pain in unspecified joint: Secondary | ICD-10-CM | POA: Diagnosis not present

## 2014-09-05 DIAGNOSIS — G629 Polyneuropathy, unspecified: Secondary | ICD-10-CM | POA: Diagnosis not present

## 2014-09-05 DIAGNOSIS — R7309 Other abnormal glucose: Secondary | ICD-10-CM | POA: Diagnosis not present

## 2014-09-05 DIAGNOSIS — F3130 Bipolar disorder, current episode depressed, mild or moderate severity, unspecified: Secondary | ICD-10-CM | POA: Diagnosis not present

## 2014-09-05 DIAGNOSIS — E559 Vitamin D deficiency, unspecified: Secondary | ICD-10-CM | POA: Diagnosis not present

## 2014-09-05 DIAGNOSIS — B1920 Unspecified viral hepatitis C without hepatic coma: Secondary | ICD-10-CM | POA: Diagnosis not present

## 2014-09-05 DIAGNOSIS — F419 Anxiety disorder, unspecified: Secondary | ICD-10-CM | POA: Diagnosis not present

## 2014-09-05 DIAGNOSIS — Z720 Tobacco use: Secondary | ICD-10-CM | POA: Diagnosis not present

## 2014-09-05 LAB — COPPER, SERUM: COPPER: 149 ug/dL (ref 72–166)

## 2014-09-05 LAB — IFE AND PE, SERUM
ALBUMIN SERPL ELPH-MCNC: 3.7 g/dL (ref 3.2–5.6)
Albumin/Glob SerPl: 1 (ref 0.7–2.0)
Alpha 1: 0.3 g/dL (ref 0.1–0.4)
Alpha2 Glob SerPl Elph-Mcnc: 0.9 g/dL (ref 0.4–1.2)
B-GLOBULIN SERPL ELPH-MCNC: 1 g/dL (ref 0.6–1.3)
GLOBULIN, TOTAL: 3.9 g/dL (ref 2.0–4.5)
Gamma Glob SerPl Elph-Mcnc: 1.7 g/dL — ABNORMAL HIGH (ref 0.5–1.6)
IgA/Immunoglobulin A, Serum: 280 mg/dL (ref 91–414)
IgG (Immunoglobin G), Serum: 1721 mg/dL — ABNORMAL HIGH (ref 700–1600)
IgM (Immunoglobulin M), Srm: 97 mg/dL (ref 40–230)
Total Protein: 7.6 g/dL (ref 6.0–8.5)

## 2014-09-05 LAB — VITAMIN B12: VITAMIN B 12: 645 pg/mL (ref 211–946)

## 2014-09-05 LAB — SEDIMENTATION RATE: SED RATE: 18 mm/h (ref 0–32)

## 2014-09-05 LAB — ANA W/REFLEX: ANA: NEGATIVE

## 2014-09-05 LAB — ANGIOTENSIN CONVERTING ENZYME: Angio Convert Enzyme: 44 U/L (ref 14–82)

## 2014-09-05 LAB — SPECIMEN STATUS REPORT

## 2014-09-05 LAB — CRYOGLOBULIN, QL, SERUM, RFLX

## 2014-09-05 LAB — HIV ANTIBODY (ROUTINE TESTING W REFLEX): HIV SCREEN 4TH GENERATION: NONREACTIVE

## 2014-09-06 ENCOUNTER — Telehealth: Payer: Self-pay | Admitting: Neurology

## 2014-09-06 NOTE — Telephone Encounter (Signed)
The phone number I have listed is incorrect.  I left message for her emergency contact to call back and leave a working number for the patient.

## 2014-09-06 NOTE — Telephone Encounter (Signed)
Pt calling requesting lab results and she also wants to have them faxed to her PCP who referred her [place].  Please all and advise.

## 2014-09-07 NOTE — Telephone Encounter (Signed)
I called the patient. The patient apparently has had a rheumatoid factor drawn through another office, indicating that the panel was positive. I did not check a rheumatoid factor.

## 2014-09-07 NOTE — Telephone Encounter (Signed)
Spoke to patient and relayed unremarkable blood work results but the patient still wants to speak to doctor.  She wants to know if she has rheumatoid arthritis or not, says that what is her PCP told her.  Her correct phone number is [phone].

## 2014-09-14 ENCOUNTER — Encounter: Payer: Medicare Other | Admitting: Radiology

## 2014-09-14 ENCOUNTER — Encounter: Payer: Medicare Other | Admitting: Neurology

## 2014-09-19 ENCOUNTER — Other Ambulatory Visit (HOSPITAL_COMMUNITY): Payer: Self-pay | Admitting: Internal Medicine

## 2014-09-19 DIAGNOSIS — R200 Anesthesia of skin: Secondary | ICD-10-CM | POA: Diagnosis not present

## 2014-09-19 DIAGNOSIS — R799 Abnormal finding of blood chemistry, unspecified: Secondary | ICD-10-CM | POA: Diagnosis not present

## 2014-09-19 DIAGNOSIS — M797 Fibromyalgia: Secondary | ICD-10-CM | POA: Diagnosis not present

## 2014-09-19 DIAGNOSIS — M79661 Pain in right lower leg: Secondary | ICD-10-CM | POA: Diagnosis not present

## 2014-09-19 DIAGNOSIS — B182 Chronic viral hepatitis C: Secondary | ICD-10-CM | POA: Diagnosis not present

## 2014-09-19 DIAGNOSIS — Z720 Tobacco use: Secondary | ICD-10-CM | POA: Diagnosis not present

## 2014-09-19 DIAGNOSIS — M2548 Effusion, other site: Secondary | ICD-10-CM | POA: Diagnosis not present

## 2014-09-19 DIAGNOSIS — M545 Low back pain: Secondary | ICD-10-CM | POA: Diagnosis not present

## 2014-09-19 DIAGNOSIS — E785 Hyperlipidemia, unspecified: Secondary | ICD-10-CM | POA: Diagnosis not present

## 2014-09-19 DIAGNOSIS — F419 Anxiety disorder, unspecified: Secondary | ICD-10-CM | POA: Diagnosis not present

## 2014-09-19 DIAGNOSIS — G629 Polyneuropathy, unspecified: Secondary | ICD-10-CM | POA: Diagnosis not present

## 2014-09-19 DIAGNOSIS — F3130 Bipolar disorder, current episode depressed, mild or moderate severity, unspecified: Secondary | ICD-10-CM | POA: Diagnosis not present

## 2014-09-19 DIAGNOSIS — E559 Vitamin D deficiency, unspecified: Secondary | ICD-10-CM | POA: Diagnosis not present

## 2014-09-19 DIAGNOSIS — B1920 Unspecified viral hepatitis C without hepatic coma: Secondary | ICD-10-CM | POA: Diagnosis not present

## 2014-09-19 DIAGNOSIS — R7309 Other abnormal glucose: Secondary | ICD-10-CM | POA: Diagnosis not present

## 2014-09-21 ENCOUNTER — Other Ambulatory Visit (HOSPITAL_COMMUNITY): Payer: Self-pay | Admitting: Internal Medicine

## 2014-09-21 DIAGNOSIS — M79661 Pain in right lower leg: Secondary | ICD-10-CM

## 2014-09-22 ENCOUNTER — Encounter (HOSPITAL_COMMUNITY): Payer: Medicare Other

## 2014-09-22 ENCOUNTER — Ambulatory Visit (HOSPITAL_COMMUNITY): Admission: RE | Admit: 2014-09-22 | Payer: Medicare Other | Source: Ambulatory Visit

## 2014-09-27 ENCOUNTER — Ambulatory Visit (HOSPITAL_COMMUNITY): Payer: Medicare Other

## 2014-09-28 ENCOUNTER — Encounter (HOSPITAL_COMMUNITY): Payer: Self-pay | Admitting: Emergency Medicine

## 2014-09-28 ENCOUNTER — Emergency Department (HOSPITAL_COMMUNITY)
Admission: EM | Admit: 2014-09-28 | Discharge: 2014-09-28 | Disposition: A | Discharge status: Home / Self Care | Payer: Medicare Other | Attending: Emergency Medicine | Admitting: Emergency Medicine

## 2014-09-28 DIAGNOSIS — Z79899 Other long term (current) drug therapy: Secondary | ICD-10-CM | POA: Diagnosis not present

## 2014-09-28 DIAGNOSIS — E559 Vitamin D deficiency, unspecified: Secondary | ICD-10-CM | POA: Diagnosis not present

## 2014-09-28 DIAGNOSIS — R52 Pain, unspecified: Secondary | ICD-10-CM | POA: Diagnosis not present

## 2014-09-28 DIAGNOSIS — F319 Bipolar disorder, unspecified: Secondary | ICD-10-CM | POA: Insufficient documentation

## 2014-09-28 DIAGNOSIS — M79605 Pain in left leg: Secondary | ICD-10-CM

## 2014-09-28 DIAGNOSIS — Z8619 Personal history of other infectious and parasitic diseases: Secondary | ICD-10-CM | POA: Diagnosis not present

## 2014-09-28 DIAGNOSIS — M545 Low back pain: Secondary | ICD-10-CM | POA: Diagnosis not present

## 2014-09-28 DIAGNOSIS — Z720 Tobacco use: Secondary | ICD-10-CM | POA: Insufficient documentation

## 2014-09-28 DIAGNOSIS — G8929 Other chronic pain: Secondary | ICD-10-CM | POA: Insufficient documentation

## 2014-09-28 DIAGNOSIS — Z8719 Personal history of other diseases of the digestive system: Secondary | ICD-10-CM | POA: Insufficient documentation

## 2014-09-28 DIAGNOSIS — E785 Hyperlipidemia, unspecified: Secondary | ICD-10-CM | POA: Insufficient documentation

## 2014-09-28 DIAGNOSIS — M797 Fibromyalgia: Secondary | ICD-10-CM | POA: Insufficient documentation

## 2014-09-28 DIAGNOSIS — M25562 Pain in left knee: Secondary | ICD-10-CM | POA: Diagnosis not present

## 2014-09-28 DIAGNOSIS — E669 Obesity, unspecified: Secondary | ICD-10-CM | POA: Insufficient documentation

## 2014-09-28 MED ORDER — KETOROLAC TROMETHAMINE 60 MG/2ML IM SOLN
60.0000 mg | Freq: Once | INTRAMUSCULAR | Status: AC
  Administered 2014-09-28: 60 mg via INTRAMUSCULAR
  Filled 2014-09-28: qty 2

## 2014-09-28 NOTE — Discharge Instructions (Signed)
Please continue taking your pain medication for your leg pain and followup closely with your hematologist for your primary care provider for further management of your condition.    Musculoskeletal Pain Musculoskeletal pain is muscle and boney aches and pains. These pains can occur in any part of the body. Your caregiver may treat you without knowing the cause of the pain. They may treat you if blood or urine tests, X-rays, and other tests were normal.  CAUSES There is often not a definite cause or reason for these pains. These pains may be caused by a type of germ (virus). The discomfort may also come from overuse. Overuse includes working out too hard when your body is not fit. Boney aches also come from weather changes. Bone is sensitive to atmospheric pressure changes. HOME CARE INSTRUCTIONS   Ask when your test results will be ready. Make sure you get your test results.  Only take over-the-counter or prescription medicines for pain, discomfort, or fever as directed by your caregiver. If you were given medications for your condition, do not drive, operate machinery or power tools, or sign legal documents for 24 hours. Do not drink alcohol. Do not take sleeping pills or other medications that may interfere with treatment.  Continue all activities unless the activities cause more pain. When the pain lessens, slowly resume normal activities. Gradually increase the intensity and duration of the activities or exercise.  During periods of severe pain, bed rest may be helpful. Lay or sit in any position that is comfortable.  Putting ice on the injured area.  Put ice in a bag.  Place a towel between your skin and the bag.  Leave the ice on for 15 to 20 minutes, 3 to 4 times a day.  Follow up with your caregiver for continued problems and no reason can be found for the pain. If the pain becomes worse or does not go away, it may be necessary to repeat tests or do additional testing. Your caregiver  may need to look further for a possible cause. SEEK IMMEDIATE MEDICAL CARE IF:  You have pain that is getting worse and is not relieved by medications.  You develop chest pain that is associated with shortness or breath, sweating, feeling sick to your stomach (nauseous), or throw up (vomit).  Your pain becomes localized to the abdomen.  You develop any new symptoms that seem different or that concern you. MAKE SURE YOU:   Understand these instructions.  Will watch your condition.  Will get help right away if you are not doing well or get worse. Document Released: 07/01/2005 Document Revised: 09/23/2011 Document Reviewed: 03/05/2013 [facility] Patient Information 2015 [employer], [place]. This information is not intended to replace advice given to you by your health care provider. Make sure you discuss any questions you have with your health care provider.

## 2014-09-28 NOTE — ED Notes (Signed)
Per EMS, pt from home,  Pt c/o chronic knee pain, worsening today. Hx of fibromyalgia. Pt has rheumatologist who has been following her for this knee pain. Pt Ambulatory with cane. Pt A&Ox4.

## 2014-09-28 NOTE — ED Provider Notes (Signed)
CSN: [id number]     Arrival date & time 09/28/14  1605 History  This chart was scribed for non-physician practitioner, Domenic Moras, PA-C,working with Orlie Dakin, MD, by Marlowe Kays, ED Scribe. This patient was seen in room WTR5/WTR5 and the patient's care was started at 4:16 PM.  Chief Complaint  Patient presents with  . Leg Pain   Patient is a 49 y.o. female presenting with leg pain. The history is provided by the patient and medical records. No language interpreter was used.  Leg Pain   HPI Comments:  Patricia Hall is a 49 y.o. obese female with PMHx of chronic pain and fibromyalgia brought in by EMS, who presents to the Emergency Department complaining of worsening left knee pain that radiates up and down leg that began two hours ago. Pt states she moved some furniture around two days ago and is now experiencing some back pain. Pt reports she was recently diagnosed with fibromyalgia a few weeks ago and is being treated with Lyrica and Hydrocodone that normally helps the pain but is not helping right now. She denies modifying factors. Denies fever, chills, nausea or vomiting, bowel or bladder incontinence or saddle anesthesia. Denies any recent surgeries, calf pain, recent travel or birth control pills. PMHx of Hepatits C, Bipolar disorder, substance abuse, cholecystitis, HLD, vitamin D deficiency.   Past Medical History  Diagnosis Date  . Hep C w/ coma, chronic   . Bipolar 1 disorder   . Substance abuse   . Cholecystitis 05/12/2012  . Paresthesia 09/01/2014  . Obese   . Hyperlipemia   . Vitamin D deficiency   . Substance abuse     cocaine  . Chronic daily headache 09/01/2014   Past Surgical History  Procedure Laterality Date  . Appendectomy    . Eye surgery      strabismus surgery  . Cholecystectomy  05/12/2012    Procedure: LAPAROSCOPIC CHOLECYSTECTOMY WITH INTRAOPERATIVE CHOLANGIOGRAM;  Surgeon: Ralene Ok, MD;  Location: [place];  Service: General;  Laterality: N/A;    Family History  Problem Relation Age of Onset  . Heart disease Mother   . Diabetes Mother   . Diabetes Father   . Heart disease Father   . Diabetes Sister   . Diabetes Brother    History  Substance Use Topics  . Smoking status: Current Every Day Smoker -- 0.50 packs/day for 25 years    Types: Cigarettes  . Smokeless tobacco: Never Used  . Alcohol Use: No   OB History    No data available     Review of Systems  Musculoskeletal: Positive for arthralgias.    Allergies  Review of patient's allergies indicates no known allergies.  Home Medications   Prior to Admission medications   Medication Sig Start Date End Date Taking? Authorizing Provider  CRESTOR 10 MG tablet Take 10 mg by mouth daily. 08/22/14   Historical Provider, MD  [employer] D3 1000 UNITS capsule Take 1,000 Units by mouth daily. 08/22/14   Historical Provider, MD  cyclobenzaprine (FLEXERIL) 5 MG tablet Take 5 mg by mouth 3 (three) times daily as needed. 08/09/14   Historical Provider, MD  DULoxetine (CYMBALTA) 30 MG capsule Take 30 mg by mouth 2 (two) times daily. 08/22/14   Historical Provider, MD  hydrOXYzine (ATARAX/VISTARIL) 50 MG tablet Take 50 mg by mouth 2 (two) times daily. 08/03/14   Historical Provider, MD  ibuprofen (ADVIL,MOTRIN) 200 MG tablet Take 800 mg by mouth every 6 (six) hours as needed for pain.  Historical Provider, MD  lamoTRIgine (LAMICTAL) 100 MG tablet Take 100 mg by mouth 2 (two) times daily.  08/04/14   Historical Provider, MD  phentermine (ADIPEX-P) 37.5 MG tablet Take 37.5 mg by mouth daily. 08/22/14   Historical Provider, MD  topiramate (TOPAMAX) 25 MG tablet Take 25 mg by mouth. Two in morning and two at night 08/04/14   Historical Provider, MD   Triage Vitals: BP 104/86 mmHg  Pulse 86  Temp(Src) 97.6 F (36.4 C) (Oral)  Resp 16  SpO2 99%  LMP 09/19/2014 Physical Exam  Constitutional: She is oriented to person, place, and time. She appears well-developed and well-nourished.  HENT:  Head:  Normocephalic and atraumatic.  Eyes: EOM are normal.  Neck: Normal range of motion.  Cardiovascular: Normal rate and intact distal pulses.   Pulmonary/Chest: Effort normal.  Musculoskeletal: Normal range of motion. She exhibits tenderness.  Tender to palpation of midline lumbar spine. No crepitis or step off. Bilateral paraspinal tenderness. Diffused tenderness throught LLE.  Neurological: She is alert and oriented to person, place, and time.  Skin: Skin is warm and dry.  Psychiatric: She has a normal mood and affect. Her behavior is normal.  Nursing note and vitals reviewed.   ED Course  Procedures (including critical care time) DIAGNOSTIC STUDIES: Oxygen Saturation is 99% on RA, normal by my interpretation.   COORDINATION OF CARE: 4:25 PM- Will give injection of Toradol. Advised pt to follow up with PCP. Pt verbalizes understanding and agrees to plan.\  Patient here with acute on chronic pain, worsened after moving furniture.. Pain is reproducible on exam. She is neurovascularly intact. She hasrisk factors concerning for DVT. She reports that her doctor has scheduled an outpatient follow-up for DVT study. At this time I have no indication to obtain vascular Doppler study for her chronic pain. Return precaution given. Patient is able to ambulate with a cane.  Medications - No data to display  Labs Review Labs Reviewed - No data to display  Imaging Review No results found.   EKG Interpretation None      MDM   Final diagnoses:  Left leg pain  Fibromyalgia affecting lower leg   BP 104/86 mmHg  Pulse 86  Temp(Src) 97.6 F (36.4 C) (Oral)  Resp 16  SpO2 99%  LMP 09/19/2014   I personally performed the services described in this documentation, which was scribed in my presence. The recorded information has been reviewed and is accurate.    Domenic Moras, PA-C 09/28/14 1715  Orlie Dakin, MD 09/29/14 0000

## 2014-09-30 ENCOUNTER — Ambulatory Visit (HOSPITAL_COMMUNITY)
Admission: RE | Admit: 2014-09-30 | Discharge: 2014-09-30 | Disposition: A | Discharge status: Home / Self Care | Payer: Medicare Other | Source: Ambulatory Visit | Attending: Internal Medicine | Admitting: Internal Medicine

## 2014-09-30 DIAGNOSIS — M79661 Pain in right lower leg: Secondary | ICD-10-CM

## 2014-09-30 NOTE — Progress Notes (Signed)
*  Preliminary Results* Right lower extremity venous duplex completed. Right lower extremity is negative for deep vein thrombosis. There is no evidence of right Baker's cyst.  09/30/2014 11:16 AM  Maudry Mayhew, RVT, RDCS, RDMS

## 2014-10-06 DIAGNOSIS — F319 Bipolar disorder, unspecified: Secondary | ICD-10-CM | POA: Diagnosis not present

## 2014-10-06 DIAGNOSIS — E139 Other specified diabetes mellitus without complications: Secondary | ICD-10-CM | POA: Diagnosis not present

## 2014-10-06 DIAGNOSIS — E785 Hyperlipidemia, unspecified: Secondary | ICD-10-CM | POA: Diagnosis not present

## 2014-10-20 DIAGNOSIS — F319 Bipolar disorder, unspecified: Secondary | ICD-10-CM | POA: Diagnosis not present

## 2014-10-21 ENCOUNTER — Other Ambulatory Visit: Payer: Self-pay

## 2014-10-21 DIAGNOSIS — Z124 Encounter for screening for malignant neoplasm of cervix: Secondary | ICD-10-CM | POA: Diagnosis not present

## 2014-10-21 DIAGNOSIS — R7309 Other abnormal glucose: Secondary | ICD-10-CM | POA: Diagnosis not present

## 2014-10-21 DIAGNOSIS — J40 Bronchitis, not specified as acute or chronic: Secondary | ICD-10-CM | POA: Diagnosis not present

## 2014-10-21 DIAGNOSIS — R7301 Impaired fasting glucose: Secondary | ICD-10-CM | POA: Diagnosis not present

## 2014-10-21 DIAGNOSIS — B1920 Unspecified viral hepatitis C without hepatic coma: Secondary | ICD-10-CM | POA: Diagnosis not present

## 2014-10-21 DIAGNOSIS — Z720 Tobacco use: Secondary | ICD-10-CM | POA: Diagnosis not present

## 2014-10-21 DIAGNOSIS — E785 Hyperlipidemia, unspecified: Secondary | ICD-10-CM | POA: Diagnosis not present

## 2014-10-21 DIAGNOSIS — E559 Vitamin D deficiency, unspecified: Secondary | ICD-10-CM | POA: Diagnosis not present

## 2014-10-21 DIAGNOSIS — Z1231 Encounter for screening mammogram for malignant neoplasm of breast: Secondary | ICD-10-CM

## 2014-10-21 DIAGNOSIS — F3130 Bipolar disorder, current episode depressed, mild or moderate severity, unspecified: Secondary | ICD-10-CM | POA: Diagnosis not present

## 2014-10-21 DIAGNOSIS — G629 Polyneuropathy, unspecified: Secondary | ICD-10-CM | POA: Diagnosis not present

## 2014-10-28 ENCOUNTER — Ambulatory Visit: Payer: Medicare Other

## 2014-10-30 ENCOUNTER — Emergency Department (HOSPITAL_COMMUNITY)
Admission: EM | Admit: 2014-10-30 | Discharge: 2014-10-30 | Disposition: A | Discharge status: Home / Self Care | Payer: Medicare Other | Attending: Emergency Medicine | Admitting: Emergency Medicine

## 2014-10-30 ENCOUNTER — Emergency Department (HOSPITAL_COMMUNITY): Payer: Medicare Other

## 2014-10-30 ENCOUNTER — Encounter (HOSPITAL_COMMUNITY): Payer: Self-pay | Admitting: Nurse Practitioner

## 2014-10-30 DIAGNOSIS — E785 Hyperlipidemia, unspecified: Secondary | ICD-10-CM | POA: Diagnosis not present

## 2014-10-30 DIAGNOSIS — Z720 Tobacco use: Secondary | ICD-10-CM | POA: Diagnosis not present

## 2014-10-30 DIAGNOSIS — Y998 Other external cause status: Secondary | ICD-10-CM | POA: Diagnosis not present

## 2014-10-30 DIAGNOSIS — S199XXA Unspecified injury of neck, initial encounter: Secondary | ICD-10-CM | POA: Diagnosis not present

## 2014-10-30 DIAGNOSIS — W19XXXA Unspecified fall, initial encounter: Secondary | ICD-10-CM

## 2014-10-30 DIAGNOSIS — Z79899 Other long term (current) drug therapy: Secondary | ICD-10-CM | POA: Diagnosis not present

## 2014-10-30 DIAGNOSIS — Z8719 Personal history of other diseases of the digestive system: Secondary | ICD-10-CM | POA: Diagnosis not present

## 2014-10-30 DIAGNOSIS — G935 Compression of brain: Secondary | ICD-10-CM | POA: Insufficient documentation

## 2014-10-30 DIAGNOSIS — Y9389 Activity, other specified: Secondary | ICD-10-CM | POA: Insufficient documentation

## 2014-10-30 DIAGNOSIS — E559 Vitamin D deficiency, unspecified: Secondary | ICD-10-CM | POA: Diagnosis not present

## 2014-10-30 DIAGNOSIS — Z8619 Personal history of other infectious and parasitic diseases: Secondary | ICD-10-CM | POA: Diagnosis not present

## 2014-10-30 DIAGNOSIS — E669 Obesity, unspecified: Secondary | ICD-10-CM | POA: Insufficient documentation

## 2014-10-30 DIAGNOSIS — Y92008 Other place in unspecified non-institutional (private) residence as the place of occurrence of the external cause: Secondary | ICD-10-CM | POA: Insufficient documentation

## 2014-10-30 DIAGNOSIS — R1031 Right lower quadrant pain: Secondary | ICD-10-CM | POA: Diagnosis not present

## 2014-10-30 DIAGNOSIS — T148 Other injury of unspecified body region: Secondary | ICD-10-CM | POA: Insufficient documentation

## 2014-10-30 DIAGNOSIS — S299XXA Unspecified injury of thorax, initial encounter: Secondary | ICD-10-CM | POA: Diagnosis not present

## 2014-10-30 DIAGNOSIS — S161XXA Strain of muscle, fascia and tendon at neck level, initial encounter: Secondary | ICD-10-CM | POA: Diagnosis not present

## 2014-10-30 DIAGNOSIS — G8929 Other chronic pain: Secondary | ICD-10-CM | POA: Diagnosis not present

## 2014-10-30 DIAGNOSIS — M797 Fibromyalgia: Secondary | ICD-10-CM | POA: Insufficient documentation

## 2014-10-30 DIAGNOSIS — W08XXXA Fall from other furniture, initial encounter: Secondary | ICD-10-CM | POA: Insufficient documentation

## 2014-10-30 DIAGNOSIS — F319 Bipolar disorder, unspecified: Secondary | ICD-10-CM | POA: Insufficient documentation

## 2014-10-30 DIAGNOSIS — R200 Anesthesia of skin: Secondary | ICD-10-CM | POA: Diagnosis not present

## 2014-10-30 DIAGNOSIS — T07XXXA Unspecified multiple injuries, initial encounter: Secondary | ICD-10-CM

## 2014-10-30 DIAGNOSIS — S3690XA Unspecified injury of unspecified intra-abdominal organ, initial encounter: Secondary | ICD-10-CM | POA: Diagnosis not present

## 2014-10-30 DIAGNOSIS — S40011A Contusion of right shoulder, initial encounter: Secondary | ICD-10-CM | POA: Diagnosis not present

## 2014-10-30 DIAGNOSIS — R0781 Pleurodynia: Secondary | ICD-10-CM | POA: Diagnosis not present

## 2014-10-30 MED ORDER — HYDROCODONE-ACETAMINOPHEN 5-325 MG PO TABS
2.0000 | ORAL_TABLET | Freq: Once | ORAL | Status: AC
  Administered 2014-10-30: 2 via ORAL
  Filled 2014-10-30: qty 2

## 2014-10-30 NOTE — ED Provider Notes (Signed)
CSN: [id number]     Arrival date & time 10/30/14  0319 History   First MD Initiated Contact with Patient 10/30/14 [phone]     Chief Complaint  Patient presents with  . Fall     (Consider location/radiation/quality/duration/timing/severity/associated sxs/prior Treatment) HPI  This is a 49 year old female who is currently on hydrocodone for fibromyalgia. She was sitting on a barstool singing car okay this morning at home. She thinks it was sometime after midnight but is not sure. She fell about 3 feet off the barstool onto her right side. There was no immediate pain but she has subsequently developed moderate to severe pain on her "entire right side". Specifically she is having pain in the right side of her neck and trapezius region as well as in her right mid lateral back. Pain is worse with movement or palpation. She has taken hydrocodone without relief.  Past Medical History  Diagnosis Date  . Hep C w/ coma, chronic   . Bipolar 1 disorder   . Substance abuse   . Cholecystitis 05/12/2012  . Paresthesia 09/01/2014  . Obese   . Hyperlipemia   . Vitamin D deficiency   . Substance abuse     cocaine  . Chronic daily headache 09/01/2014   Past Surgical History  Procedure Laterality Date  . Appendectomy    . Eye surgery      strabismus surgery  . Cholecystectomy  05/12/2012    Procedure: LAPAROSCOPIC CHOLECYSTECTOMY WITH INTRAOPERATIVE CHOLANGIOGRAM;  Surgeon: Ralene Ok, MD;  Location: [place];  Service: General;  Laterality: N/A;   Family History  Problem Relation Age of Onset  . Heart disease Mother   . Diabetes Mother   . Diabetes Father   . Heart disease Father   . Diabetes Sister   . Diabetes Brother    History  Substance Use Topics  . Smoking status: Current Every Day Smoker -- 0.50 packs/day for 25 years    Types: Cigarettes  . Smokeless tobacco: Never Used  . Alcohol Use: No   OB History    No data available     Review of Systems  All other systems reviewed and  are negative.   Allergies  Review of patient's allergies indicates no known allergies.  Home Medications   Prior to Admission medications   Medication Sig Start Date End Date Taking? Authorizing Provider  CRESTOR 10 MG tablet Take 10 mg by mouth daily. 08/22/14  Yes Historical Provider, MD  [employer]S D3 1000 UNITS capsule Take 1,000 Units by mouth daily. 08/22/14  Yes Historical Provider, MD  HYDROcodone-acetaminophen (NORCO/VICODIN) 5-325 MG per tablet Take 1 tablet by mouth every 6 (six) hours as needed for moderate pain.   Yes Historical Provider, MD  hydrOXYzine (ATARAX/VISTARIL) 50 MG tablet Take 50 mg by mouth 2 (two) times daily. 08/03/14  Yes Historical Provider, MD  hydrOXYzine (VISTARIL) 50 MG capsule Take 50 mg by mouth 3 (three) times daily. 10/20/14  Yes Historical Provider, MD  ibuprofen (ADVIL,MOTRIN) 200 MG tablet Take 800 mg by mouth every 6 (six) hours as needed for pain.   Yes Historical Provider, MD  lamoTRIgine (LAMICTAL) 100 MG tablet Take 100 mg by mouth 2 (two) times daily.  08/04/14  Yes Historical Provider, MD  LATUDA 60 MG TABS Take 1 tablet by mouth at bedtime. 10/20/14  Yes Historical Provider, MD  LYRICA 200 MG capsule Take 200 mg by mouth 2 (two) times daily. 10/21/14  Yes Historical Provider, MD  phentermine (ADIPEX-P) 37.5 MG tablet  Take 37.5 mg by mouth daily. 08/22/14  Yes Historical Provider, MD  pregabalin (LYRICA) 100 MG capsule Take 100 mg by mouth daily.   Yes Historical Provider, MD   BP 146/105 mmHg  Pulse 66  Temp(Src) 97.7 F (36.5 C) (Oral)  Resp 16  SpO2 100%  LMP 10/09/2014 (Within Days)   Physical Exam  General: Well-developed, well-nourished female in no acute distress; appearance consistent with age of record HENT: normocephalic; atraumatic Eyes: pupils equal, round and reactive to light; extraocular muscles intact Neck: supple; posterolateral soft tissue tenderness, with tenderness of the adjacent right trapezius Heart: regular rate and rhythm Lungs:  clear to auscultation bilaterally Chest: No anterior chest tenderness; bilateral lower posterior rib tenderness without step off or crepitus Abdomen: soft; nondistended; nontender; bowel sounds present Back: No spinal tenderness Extremities: No deformity; full range of motion; pulses normal Neurologic: Awake, alert and oriented; motor function intact in all extremities and symmetric; no facial droop Skin: Warm and dry Psychiatric: Normal mood and affect    ED Course  Procedures (including critical care time)   MDM  Nursing notes and vitals signs, including pulse oximetry, reviewed.  Summary of this visit's results, reviewed by myself:  Imaging Studies: Dg Ribs Unilateral Left  10/30/2014   CLINICAL DATA:  Fall with bilateral rib pain.  Initial encounter.  EXAM: LEFT RIBS - 2 VIEW  COMPARISON:  None.  FINDINGS: There is a remote lateral left sixth rib fracture. No acute fracture or other bone lesion identified. A pain marker on the left projects inferior to the twelfth rib.  IMPRESSION: No acute rib fracture.   Electronically Signed   By: Monte Fantasia M.D.   On: 10/30/2014 04:54   Dg Ribs Unilateral W/chest Right  10/30/2014   CLINICAL DATA:  Bilateral rib pain and right shoulder pain after fall tonight.  EXAM: RIGHT RIBS AND CHEST - 3+ VIEW  COMPARISON:  04/28/2011  FINDINGS: No fracture or other bone lesions are seen involving the ribs. There is a mild T12 compression deformity which is chronic based on chest x-ray from 2012. There is no evidence of pneumothorax or pleural effusion. Both lungs are clear. Heart size and mediastinal contours are within normal limits.  IMPRESSION: Negative.   Electronically Signed   By: Monte Fantasia M.D.   On: 10/30/2014 04:47   Dg Cervical Spine Complete  10/30/2014   CLINICAL DATA:  Bilateral rib pain, RIGHT shoulder pain after fell tonight from stool.  EXAM: CERVICAL SPINE  4+ VIEWS  COMPARISON:  None.  FINDINGS: No malalignment.  However, mildly  widened C3-4 facets with cortical irregularity. Alignment is normal. Moderate C5-6 disc height loss, endplate sclerosis and marginal spurring consistent with degenerative disc. No definite neural foraminal narrowing though, limited assessment of the lower cervical spine due to large body habitus. No other significant bone abnormalities are identified. Limited assessment of prevertebral soft tissues due to large body habitus and oblique positioning.  IMPRESSION: Slightly widened C3-4 facets with cortical regularity equivocal for acute injury, and would be better characterized on CT of the cervical spine. No malalignment.   Electronically Signed   By: Elon Alas   On: 10/30/2014 04:52   Ct Cervical Spine Wo Contrast  10/30/2014   CLINICAL DATA:  Fall with numbness and tingling of the right shoulder.  EXAM: CT CERVICAL SPINE WITHOUT CONTRAST  TECHNIQUE: Multidetector CT imaging of the cervical spine was performed without intravenous contrast. Multiplanar CT image reconstructions were also generated.  COMPARISON:  None.  FINDINGS: No evidence of fracture or traumatic malalignment. No gross cervical canal hematoma or prevertebral edema. There is C5-6 and C6-7 degenerative disc disease with endplate and uncovertebral spurs. Mild right C5-6 foraminal narrowing but no advanced foraminal stenosis to explain the possible radicular symptoms.  Low and pointed cerebellar tonsils, extending to the level of C1 posterior ring, 17 mm below the foramen magnum. The tonsils are pointed and there is foramen magnum crowding.  IMPRESSION: 1. No evidence of acute cervical spine injury. 2. Chiari 1 malformation.  Recommend neurosurgical referral. 3. C5-6 and C6-7 degenerative disc disease.   Electronically Signed   By: Monte Fantasia M.D.   On: 10/30/2014 05:48   5:56 AM Patient advised to CT findings including incidental Chiari I malformation. She was advised that as she is currently treated for pain by her primary care  physician she will need to consult with him regarding further narcotic prescriptions.    Shanon Rosser, MD 10/30/14 [phone]

## 2014-10-30 NOTE — ED Notes (Signed)
Pt presents from with c/o right side pain secondary to a fall off a 3 ft tal stool. Denies neck pain but c/o numbness and tingling on her right shoulder. Rating pain 8/10 took hydrocodone 5mg -325 about an hour PTA.

## 2014-10-30 NOTE — ED Notes (Signed)
Bed: WA03 Expected date:  Expected time:  Means of arrival:  Comments: EMS fall off stool, Rt side of body sore

## 2014-10-31 DIAGNOSIS — F3111 Bipolar disorder, current episode manic without psychotic features, mild: Secondary | ICD-10-CM | POA: Diagnosis not present

## 2014-11-04 ENCOUNTER — Ambulatory Visit: Payer: Medicare Other

## 2014-11-07 DIAGNOSIS — F3111 Bipolar disorder, current episode manic without psychotic features, mild: Secondary | ICD-10-CM | POA: Diagnosis not present

## 2014-12-14 DIAGNOSIS — F419 Anxiety disorder, unspecified: Secondary | ICD-10-CM | POA: Diagnosis not present

## 2014-12-14 DIAGNOSIS — F3130 Bipolar disorder, current episode depressed, mild or moderate severity, unspecified: Secondary | ICD-10-CM | POA: Diagnosis not present

## 2014-12-14 DIAGNOSIS — R7309 Other abnormal glucose: Secondary | ICD-10-CM | POA: Diagnosis not present

## 2014-12-14 DIAGNOSIS — J40 Bronchitis, not specified as acute or chronic: Secondary | ICD-10-CM | POA: Diagnosis not present

## 2014-12-14 DIAGNOSIS — E785 Hyperlipidemia, unspecified: Secondary | ICD-10-CM | POA: Diagnosis not present

## 2014-12-14 DIAGNOSIS — R635 Abnormal weight gain: Secondary | ICD-10-CM | POA: Diagnosis not present

## 2014-12-14 DIAGNOSIS — E559 Vitamin D deficiency, unspecified: Secondary | ICD-10-CM | POA: Diagnosis not present

## 2014-12-14 DIAGNOSIS — G629 Polyneuropathy, unspecified: Secondary | ICD-10-CM | POA: Diagnosis not present

## 2014-12-22 ENCOUNTER — Ambulatory Visit: Payer: Medicare Other | Admitting: Neurology

## 2014-12-23 ENCOUNTER — Encounter: Payer: Self-pay | Admitting: Neurology

## 2015-02-04 ENCOUNTER — Encounter (HOSPITAL_COMMUNITY): Payer: Self-pay

## 2015-02-04 ENCOUNTER — Encounter (HOSPITAL_COMMUNITY): Payer: Self-pay | Admitting: Emergency Medicine

## 2015-02-04 ENCOUNTER — Emergency Department (HOSPITAL_COMMUNITY)
Admission: EM | Admit: 2015-02-04 | Discharge: 2015-02-04 | Disposition: A | Discharge status: Other Acute Inpatient Hospital | Payer: Medicare Other | Attending: Emergency Medicine | Admitting: Emergency Medicine

## 2015-02-04 ENCOUNTER — Inpatient Hospital Stay (HOSPITAL_COMMUNITY)
Admission: AD | Admit: 2015-02-04 | Discharge: 2015-02-09 | DRG: 885 | Disposition: A | Discharge status: Home / Self Care | Payer: Medicare Other | Source: Intra-hospital | Attending: Psychiatry | Admitting: Psychiatry

## 2015-02-04 DIAGNOSIS — E785 Hyperlipidemia, unspecified: Secondary | ICD-10-CM | POA: Diagnosis present

## 2015-02-04 DIAGNOSIS — F314 Bipolar disorder, current episode depressed, severe, without psychotic features: Secondary | ICD-10-CM | POA: Diagnosis not present

## 2015-02-04 DIAGNOSIS — T426X6A Underdosing of other antiepileptic and sedative-hypnotic drugs, initial encounter: Secondary | ICD-10-CM | POA: Diagnosis present

## 2015-02-04 DIAGNOSIS — Z79899 Other long term (current) drug therapy: Secondary | ICD-10-CM | POA: Diagnosis not present

## 2015-02-04 DIAGNOSIS — F1410 Cocaine abuse, uncomplicated: Secondary | ICD-10-CM | POA: Diagnosis not present

## 2015-02-04 DIAGNOSIS — G4700 Insomnia, unspecified: Secondary | ICD-10-CM | POA: Diagnosis present

## 2015-02-04 DIAGNOSIS — G479 Sleep disorder, unspecified: Secondary | ICD-10-CM | POA: Insufficient documentation

## 2015-02-04 DIAGNOSIS — R0789 Other chest pain: Secondary | ICD-10-CM | POA: Diagnosis not present

## 2015-02-04 DIAGNOSIS — Z720 Tobacco use: Secondary | ICD-10-CM | POA: Insufficient documentation

## 2015-02-04 DIAGNOSIS — Z833 Family history of diabetes mellitus: Secondary | ICD-10-CM

## 2015-02-04 DIAGNOSIS — M797 Fibromyalgia: Secondary | ICD-10-CM | POA: Diagnosis present

## 2015-02-04 DIAGNOSIS — A599 Trichomoniasis, unspecified: Secondary | ICD-10-CM | POA: Diagnosis present

## 2015-02-04 DIAGNOSIS — E559 Vitamin D deficiency, unspecified: Secondary | ICD-10-CM | POA: Diagnosis not present

## 2015-02-04 DIAGNOSIS — E669 Obesity, unspecified: Secondary | ICD-10-CM | POA: Diagnosis not present

## 2015-02-04 DIAGNOSIS — Z3202 Encounter for pregnancy test, result negative: Secondary | ICD-10-CM | POA: Insufficient documentation

## 2015-02-04 DIAGNOSIS — M791 Myalgia: Secondary | ICD-10-CM | POA: Insufficient documentation

## 2015-02-04 DIAGNOSIS — R079 Chest pain, unspecified: Secondary | ICD-10-CM | POA: Insufficient documentation

## 2015-02-04 DIAGNOSIS — B182 Chronic viral hepatitis C: Secondary | ICD-10-CM | POA: Diagnosis present

## 2015-02-04 DIAGNOSIS — R45851 Suicidal ideations: Secondary | ICD-10-CM | POA: Insufficient documentation

## 2015-02-04 DIAGNOSIS — G8929 Other chronic pain: Secondary | ICD-10-CM | POA: Diagnosis present

## 2015-02-04 DIAGNOSIS — F17210 Nicotine dependence, cigarettes, uncomplicated: Secondary | ICD-10-CM | POA: Diagnosis present

## 2015-02-04 DIAGNOSIS — Z8249 Family history of ischemic heart disease and other diseases of the circulatory system: Secondary | ICD-10-CM | POA: Diagnosis not present

## 2015-02-04 DIAGNOSIS — Z91128 Patient's intentional underdosing of medication regimen for other reason: Secondary | ICD-10-CM | POA: Diagnosis present

## 2015-02-04 DIAGNOSIS — F329 Major depressive disorder, single episode, unspecified: Secondary | ICD-10-CM | POA: Diagnosis not present

## 2015-02-04 DIAGNOSIS — Z8619 Personal history of other infectious and parasitic diseases: Secondary | ICD-10-CM | POA: Insufficient documentation

## 2015-02-04 DIAGNOSIS — F419 Anxiety disorder, unspecified: Secondary | ICD-10-CM | POA: Diagnosis present

## 2015-02-04 DIAGNOSIS — F3130 Bipolar disorder, current episode depressed, mild or moderate severity, unspecified: Secondary | ICD-10-CM | POA: Diagnosis present

## 2015-02-04 DIAGNOSIS — F32A Depression, unspecified: Secondary | ICD-10-CM | POA: Insufficient documentation

## 2015-02-04 LAB — RAPID URINE DRUG SCREEN, HOSP PERFORMED
Amphetamines: NOT DETECTED
BARBITURATES: NOT DETECTED
BENZODIAZEPINES: NOT DETECTED
COCAINE: POSITIVE — AB
Opiates: NOT DETECTED
TETRAHYDROCANNABINOL: NOT DETECTED

## 2015-02-04 LAB — COMPREHENSIVE METABOLIC PANEL
ALT: 21 U/L (ref 14–54)
ANION GAP: 7 (ref 5–15)
AST: 25 U/L (ref 15–41)
Albumin: 3.4 g/dL — ABNORMAL LOW (ref 3.5–5.0)
Alkaline Phosphatase: 85 U/L (ref 38–126)
BILIRUBIN TOTAL: 0.6 mg/dL (ref 0.3–1.2)
BUN: 15 mg/dL (ref 6–20)
CO2: 25 mmol/L (ref 22–32)
Calcium: 8.7 mg/dL — ABNORMAL LOW (ref 8.9–10.3)
Chloride: 104 mmol/L (ref 101–111)
Creatinine, Ser: 0.85 mg/dL (ref 0.44–1.00)
GFR calc Af Amer: >60 mL/min (ref >60)
GLUCOSE: 108 mg/dL — AB (ref 65–99)
Potassium: 3.7 mmol/L (ref 3.5–5.1)
SODIUM: 136 mmol/L (ref 135–145)
Total Protein: 7.3 g/dL (ref 6.5–8.1)

## 2015-02-04 LAB — CBC
HCT: 41.9 % (ref 36.0–46.0)
Hemoglobin: 14.1 g/dL (ref 12.0–15.0)
MCH: 28.8 pg (ref 26.0–34.0)
MCHC: 33.7 g/dL (ref 30.0–36.0)
MCV: 85.5 fL (ref 78.0–100.0)
PLATELETS: 245 10*3/uL (ref 150–400)
RBC: 4.9 MIL/uL (ref 3.87–5.11)
RDW: 14.4 % (ref 11.5–15.5)
WBC: 8.2 10*3/uL (ref 4.0–10.5)

## 2015-02-04 LAB — SALICYLATE LEVEL: Salicylate Lvl: <4 mg/dL (ref 2.8–30.0)

## 2015-02-04 LAB — ACETAMINOPHEN LEVEL: Acetaminophen (Tylenol), Serum: <10 ug/mL — ABNORMAL LOW (ref 10–30)

## 2015-02-04 LAB — ETHANOL: Alcohol, Ethyl (B): <5 mg/dL (ref <5)

## 2015-02-04 LAB — PREGNANCY, URINE: PREG TEST UR: NEGATIVE

## 2015-02-04 MED ORDER — MAGNESIUM HYDROXIDE 400 MG/5ML PO SUSP
30.0000 mL | Freq: Every day | ORAL | Status: DC | PRN
  Administered 2015-02-05: 30 mL via ORAL
  Filled 2015-02-04: qty 30

## 2015-02-04 MED ORDER — ENSURE ENLIVE PO LIQD
237.0000 mL | Freq: Two times a day (BID) | ORAL | Status: DC
  Administered 2015-02-05 – 2015-02-08 (×5): 237 mL via ORAL

## 2015-02-04 MED ORDER — NICOTINE 21 MG/24HR TD PT24
21.0000 mg | MEDICATED_PATCH | Freq: Once | TRANSDERMAL | Status: DC
  Administered 2015-02-04: 21 mg via TRANSDERMAL
  Filled 2015-02-04: qty 1

## 2015-02-04 MED ORDER — ALUM & MAG HYDROXIDE-SIMETH 200-200-20 MG/5ML PO SUSP: 30.0000 mL | ORAL | Status: DC | PRN

## 2015-02-04 MED ORDER — IBUPROFEN 200 MG PO TABS
600.0000 mg | ORAL_TABLET | Freq: Three times a day (TID) | ORAL | Status: DC | PRN
  Administered 2015-02-04: 600 mg via ORAL
  Filled 2015-02-04: qty 3

## 2015-02-04 MED ORDER — ACETAMINOPHEN 325 MG PO TABS: 650.0000 mg | ORAL_TABLET | Freq: Four times a day (QID) | ORAL | Status: DC | PRN

## 2015-02-04 MED ORDER — HYDROXYZINE HCL 25 MG PO TABS
25.0000 mg | ORAL_TABLET | ORAL | Status: DC | PRN
  Administered 2015-02-05 – 2015-02-07 (×4): 25 mg via ORAL
  Filled 2015-02-04 (×4): qty 1

## 2015-02-04 NOTE — ED Notes (Addendum)
Pt was brought from the main ER. She states she stopped taking her meds 2 months ago because she felt very depressed. Ptstated,"I have been clean for three years and started to use again.I thought I could move and do it on my own but I could not." Pt does contract for safety.pt is pleasant and cooperative.pt was given breakfast. Pt ate 100% of her breakfast. 4;45p-Pt given 600mg  of motrin for a headache a 6/10.Pt was encouraged to drink as she is stating she feels lightheaded. Phoned [facility] at 4:55pm to transport pt to room 404-1-pt told the nurse she needs to have a BM.Instructed pt to eat green leafy vegetables and to continue to drink plenty of water. 5p-Received a phone call to hold transporting pt across the street. Report was given

## 2015-02-04 NOTE — Progress Notes (Addendum)
Patient accepted to [facility] Bed 405-2 per Randall Hiss, [place]  PATIENT MUST COME AFTER 5 PM  Patricia Hall. MSW, LCSW Therapeutic Triage Services-Triage Specialist   Phone: [phone]
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
## 2015-02-04 NOTE — Progress Notes (Signed)
Received report from [place], Therapist, sports. Patient is in the process of being transported to [place]. Counselor requested to assess patient once she is moved.

## 2015-02-04 NOTE — ED Provider Notes (Signed)
CSN: [id number]     Arrival date & time 02/04/15  2297 History   First MD Initiated Contact with Patient 02/04/15 [phone]     Chief Complaint  Patient presents with  . Suicidal     (Consider location/radiation/quality/duration/timing/severity/associated sxs/prior Treatment) HPI Comments: Patient with a history of bipolar disorder, substance abuse, fibromyalgia and hepatitis C presents with depression and suicidal thoughts. She states that she started using crack cocaine again about 2 months ago and stopped taking all of her medications. She states her depression is been worsening over the last couple weeks. She is having thoughts of suicide and says that she doesn't want to live anymore. She denies any plan. She denies any hallucinations. She denies any other drug use. She denies any alcohol use. She does complain of myalgias particularly in her legs which is consistent with her past fibromyalgia. She denies any recent injuries. She does have pain in her chest which is worse with movement and she states has been going on for a long time. It's unchanged from her baseline. She denies any shortness of breath. There is no nausea or vomiting. Her last cocaine use was 2 days ago.   Past Medical History  Diagnosis Date  . Hep C w/ coma, chronic   . Bipolar 1 disorder   . Substance abuse   . Cholecystitis 05/12/2012  . Paresthesia 09/01/2014  . Obese   . Hyperlipemia   . Vitamin D deficiency   . Substance abuse     cocaine  . Chronic daily headache 09/01/2014   Past Surgical History  Procedure Laterality Date  . Appendectomy    . Eye surgery      strabismus surgery  . Cholecystectomy  05/12/2012    Procedure: LAPAROSCOPIC CHOLECYSTECTOMY WITH INTRAOPERATIVE CHOLANGIOGRAM;  Surgeon: Ralene Ok, MD;  Location: [facility];  Service: General;  Laterality: N/A;   Family History  Problem Relation Age of Onset  . Heart disease Mother   . Diabetes Mother   . Diabetes Father   . Heart disease  Father   . Diabetes Sister   . Diabetes Brother    History  Substance Use Topics  . Smoking status: Current Every Day Smoker -- 0.50 packs/day for 25 years    Types: Cigarettes  . Smokeless tobacco: Never Used  . Alcohol Use: No     Comment: Patient denies    OB History    No data available     Review of Systems  Constitutional: Negative for fever, chills, diaphoresis and fatigue.  HENT: Negative for congestion, rhinorrhea and sneezing.   Eyes: Negative.   Respiratory: Negative for cough, chest tightness and shortness of breath.   Cardiovascular: Positive for chest pain. Negative for leg swelling.  Gastrointestinal: Negative for nausea, vomiting, abdominal pain, diarrhea and blood in stool.  Genitourinary: Negative for frequency, hematuria, flank pain and difficulty urinating.  Musculoskeletal: Positive for myalgias. Negative for back pain and arthralgias.  Skin: Negative for rash.  Neurological: Negative for dizziness, speech difficulty, weakness, numbness and headaches.  Psychiatric/Behavioral: Positive for suicidal ideas, sleep disturbance and dysphoric mood.      Allergies  Review of patient's allergies indicates no known allergies.  Home Medications   Prior to Admission medications   Medication Sig Start Date End Date Taking? Authorizing Provider  CRESTOR 10 MG tablet Take 10 mg by mouth daily. 08/22/14   Historical Provider, MD  [facility] D3 1000 UNITS capsule Take 1,000 Units by mouth daily. 08/22/14   Historical Provider, MD  HYDROcodone-acetaminophen (NORCO/VICODIN) 5-325 MG per tablet Take 1 tablet by mouth every 6 (six) hours as needed for moderate pain.    Historical Provider, MD  hydrOXYzine (ATARAX/VISTARIL) 50 MG tablet Take 50 mg by mouth 2 (two) times daily. 08/03/14   Historical Provider, MD  ibuprofen (ADVIL,MOTRIN) 200 MG tablet Take 800 mg by mouth every 6 (six) hours as needed for pain.    Historical Provider, MD  lamoTRIgine (LAMICTAL) 100 MG tablet Take 100 mg  by mouth 2 (two) times daily.  08/04/14   Historical Provider, MD  LATUDA 60 MG TABS Take 1 tablet by mouth at bedtime. 10/20/14   Historical Provider, MD  LYRICA 200 MG capsule Take 200 mg by mouth 2 (two) times daily. 10/21/14   Historical Provider, MD  phentermine (ADIPEX-P) 37.5 MG tablet Take 37.5 mg by mouth daily. 08/22/14   Historical Provider, MD  pregabalin (LYRICA) 100 MG capsule Take 100 mg by mouth daily.    Historical Provider, MD   BP 114/87 mmHg  Pulse 76  Temp(Src) 97.5 F (36.4 C) (Oral)  Resp 16  SpO2 98% Physical Exam  Constitutional: She is oriented to person, place, and time. She appears well-developed and well-nourished.  Patient is tearful  HENT:  Head: Normocephalic and atraumatic.  Eyes: Pupils are equal, round, and reactive to light.  Neck: Normal range of motion. Neck supple.  Cardiovascular: Normal rate, regular rhythm and normal heart sounds.   Pulmonary/Chest: Effort normal and breath sounds normal. No respiratory distress. She has no wheezes. She has no rales. She exhibits tenderness (mild tenderness to palpation along the sternum).  Abdominal: Soft. Bowel sounds are normal. There is no tenderness. There is no rebound and no guarding.  Musculoskeletal: Normal range of motion. She exhibits no edema.  Lymphadenopathy:    She has no cervical adenopathy.  Neurological: She is alert and oriented to person, place, and time.  Skin: Skin is warm and dry. No rash noted.  Psychiatric: She has a normal mood and affect.    ED Course  Procedures (including critical care time) Labs Review Labs Reviewed  COMPREHENSIVE METABOLIC PANEL - Abnormal; Notable for the following:    Glucose, Bld 108 (*)    Calcium 8.7 (*)    Albumin 3.4 (*)    All other components within normal limits  ACETAMINOPHEN LEVEL - Abnormal; Notable for the following:    Acetaminophen (Tylenol), Serum <10 (*)    All other components within normal limits  URINE RAPID DRUG SCREEN, HOSP PERFORMED -  Abnormal; Notable for the following:    Cocaine POSITIVE (*)    All other components within normal limits  ETHANOL  SALICYLATE LEVEL  CBC  PREGNANCY, URINE    Imaging Review No results found.   EKG Interpretation   Date/Time:  Saturday February 04 2015 09:21:41 EDT Ventricular Rate:  67 PR Interval:  135 QRS Duration: 93 QT Interval:  421 QTC Calculation: 444 R Axis:   7 Text Interpretation:  Sinus rhythm Baseline wander in lead(s) V3 since  last tracing no significant change Confirmed by Artemus Romanoff  MD, Tayana Shankle ([id number])  on 02/04/2015 9:51:28 AM      MDM   Final diagnoses:  Depression    Patient has been medically cleared, TTS evaluation pending.    Malvin Johns, MD 02/04/15 1046

## 2015-02-04 NOTE — ED Notes (Signed)
Pt states that she stopped taking all of her psych meds about 2 months ago and started using crack about the same.  Denies any other substance abuse.  Pt states that she is suicidal without plan. Denies HI.

## 2015-02-05 ENCOUNTER — Encounter (HOSPITAL_COMMUNITY): Payer: Self-pay | Admitting: Registered Nurse

## 2015-02-05 DIAGNOSIS — R45851 Suicidal ideations: Secondary | ICD-10-CM

## 2015-02-05 MED ORDER — ROSUVASTATIN CALCIUM 10 MG PO TABS
10.0000 mg | ORAL_TABLET | Freq: Every day | ORAL | Status: DC
  Administered 2015-02-05 – 2015-02-08 (×4): 10 mg via ORAL
  Filled 2015-02-05 (×7): qty 1

## 2015-02-05 MED ORDER — IBUPROFEN 800 MG PO TABS
800.0000 mg | ORAL_TABLET | Freq: Four times a day (QID) | ORAL | Status: DC | PRN
  Administered 2015-02-06 – 2015-02-07 (×2): 800 mg via ORAL
  Filled 2015-02-05 (×2): qty 1

## 2015-02-05 MED ORDER — LURASIDONE HCL 40 MG PO TABS
60.0000 mg | ORAL_TABLET | Freq: Every day | ORAL | Status: DC
  Administered 2015-02-05 – 2015-02-08 (×4): 60 mg via ORAL
  Filled 2015-02-05: qty 5
  Filled 2015-02-05 (×6): qty 2

## 2015-02-05 MED ORDER — HYDROXYZINE HCL 50 MG PO TABS: 50.0000 mg | ORAL_TABLET | Freq: Two times a day (BID) | ORAL | Status: DC

## 2015-02-05 MED ORDER — LAMOTRIGINE 25 MG PO TABS: 25.0000 mg | ORAL_TABLET | Freq: Every day | ORAL | Status: DC

## 2015-02-05 MED ORDER — VITAMIN D 1000 UNITS PO TABS
1000.0000 [IU] | ORAL_TABLET | Freq: Every day | ORAL | Status: DC
Start: 2015-02-05 — End: 2015-02-09
  Administered 2015-02-05 – 2015-02-09 (×5): 1000 [IU] via ORAL
  Filled 2015-02-05 (×8): qty 1

## 2015-02-05 MED ORDER — LAMOTRIGINE 25 MG PO TABS
25.0000 mg | ORAL_TABLET | Freq: Every day | ORAL | Status: DC
  Administered 2015-02-05 – 2015-02-09 (×5): 25 mg via ORAL
  Filled 2015-02-05: qty 3
  Filled 2015-02-05 (×7): qty 1

## 2015-02-05 MED ORDER — NICOTINE 21 MG/24HR TD PT24
21.0000 mg | MEDICATED_PATCH | Freq: Every day | TRANSDERMAL | Status: DC
  Administered 2015-02-05 – 2015-02-09 (×5): 21 mg via TRANSDERMAL
  Filled 2015-02-05: qty 1
  Filled 2015-02-05: qty 14
  Filled 2015-02-05 (×7): qty 1

## 2015-02-05 MED ORDER — PREGABALIN 25 MG PO CAPS
25.0000 mg | ORAL_CAPSULE | Freq: Two times a day (BID) | ORAL | Status: DC
  Administered 2015-02-05 – 2015-02-06 (×3): 25 mg via ORAL
  Filled 2015-02-05 (×3): qty 1
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
## 2015-02-05 NOTE — [facility] Group Notes (Signed)
[facility] Group Notes:  (Nursing/MHT/Case Management/Adjunct)  Date:  02/05/2015  Time:  1000   Type of Therapy:  Nurse Education  Participation Level:  Active  Participation Quality:  Appropriate  Affect:  Appropriate and Depressed  Cognitive:  Alert and Oriented  Insight:  Improving  Engagement in Group:  Improving  Modes of Intervention:  Education and Support  Summary of Progress/Problems: Pearlee was appropriate during group.  Marya Landry 02/05/2015, 12:51 PM

## 2015-02-05 NOTE — H&P (Signed)
Psychiatric Admission Assessment Adult  Patient Identification: Patricia Hall MRN:  [id number] Date of Evaluation:  02/05/2015 Chief Complaint:  BIPOLAR, DEPRESSED Principal Diagnosis: Bipolar disorder, current episode depressed, severe, without psychotic features Diagnosis:   Patient Active Problem List   Diagnosis Date Noted  . Bipolar disorder, current episode depressed, severe, without psychotic features [F31.4] 02/04/2015  . Bipolar affective disorder, depressed [F31.30] 02/04/2015  . Depression [F32.9]   . Suicide ideation [R45.851]   . Paresthesia [R20.2] 09/01/2014  . Chronic daily headache [R51] 09/01/2014  . Acute cholecystitis [K81.0] 05/12/2012   History of Present Illness:: Patient states that she was recently diagnosed with fibromyalgia "And I freaked the hell out; cause I'm use to being active and my doctor told me that I would end up being in a wheel chair; what man want a woman in a wheel chair. I stopped cleaning my house, caring for my self and shit like that and started smoking crack.  I came here to get clean; to get help.  I had been clean for almost 3 years up to 2 months ago."  Patient states that she has no family "I don't have nobody other than the NA family all of my family is dead.  I don't have no body but my 3 cats; Now I didn't care about my self; I didn't care if I ate; but my cats ate."  Patient states that she started to have suicidal thought but no specific plan.  "I was just having the thoughts just to die; no plan; something with no pain; I can't stand no pain.  When you smoking crack you don't feel no pain." Patient states that she is medicated by her primary doctor and that she has been off of her medication for 2-3 months.  States that she stopped taking medications after her diagnosis of fibromyalgia.  States that she does have a therapist.  At this time patient continues to endorse depression, helplessness, anxiety, loss of interest, and low self esteem.       Elements:  Location:  Worsening depression. Quality:  Suicidal thoughts. Severity:  Sever. Duration:  Several days. Associated Signs/Symptoms: Depression Symptoms:  depressed mood, anhedonia, fatigue, feelings of worthlessness/guilt, hopelessness, recurrent thoughts of death, suicidal thoughts without plan, anxiety, loss of energy/fatigue, disturbed sleep, (Hypo) Manic Symptoms:  Impulsivity, Irritable Mood, Anxiety Symptoms:  Excessive Worry, Panic Symptoms, Psychotic Symptoms:  Denies PTSD Symptoms: Denies Total Time spent with patient: 1 hour  Past Medical History:  Past Medical History  Diagnosis Date  . Hep C w/ coma, chronic   . Bipolar 1 disorder   . Substance abuse   . Cholecystitis 05/12/2012  . Paresthesia 09/01/2014  . Obese   . Hyperlipemia   . Vitamin D deficiency   . Substance abuse     cocaine  . Chronic daily headache 09/01/2014    Past Surgical History  Procedure Laterality Date  . Appendectomy    . Eye surgery      strabismus surgery  . Cholecystectomy  05/12/2012    Procedure: LAPAROSCOPIC CHOLECYSTECTOMY WITH INTRAOPERATIVE CHOLANGIOGRAM;  Surgeon: Ralene Ok, MD;  Location: [facility];  Service: General;  Laterality: N/A;   Family History:  Family History  Problem Relation Age of Onset  . Heart disease Mother   . Diabetes Mother   . Diabetes Father   . Heart disease Father   . Diabetes Sister   . Diabetes Brother   Denies psychiatric family history Social History:  History  Alcohol Use No  Comment: Patient denies      History  Drug Use  . Yes  . Special: "Crack" cocaine    Comment: daily cocaine use. as much as I can get.     History   Social History  . Marital Status: Legally Separated    Spouse Name: N/A  . Number of Children: N/A  . Years of Education: N/A   Social History Main Topics  . Smoking status: Current Every Day Smoker -- 0.50 packs/day for 25 years    Types: Cigarettes  . Smokeless tobacco: Never  Used  . Alcohol Use: No     Comment: Patient denies   . Drug Use: Yes    Special: "Crack" cocaine     Comment: daily cocaine use. as much as I can get.   Marland Kitchen Sexual Activity: Yes    Birth Control/ Protection: Condom   Other Topics Concern  . None   Social History Narrative   Additional Social History:   Musculoskeletal: Strength & Muscle Tone: within normal limits Gait & Station: normal Patient leans: N/A  Psychiatric Specialty Exam: Physical Exam  Constitutional: She is oriented to person, place, and time.  Neck: Normal range of motion.  Musculoskeletal: Normal range of motion.  Neurological: She is alert and oriented to person, place, and time.  Psychiatric: Her behavior is normal. Her mood appears anxious. Her speech is rapid and/or pressured. Cognition and memory are normal. She expresses impulsivity. She exhibits a depressed mood. She expresses suicidal ideation.    Review of Systems  Musculoskeletal:       Fibromyalgia   Psychiatric/Behavioral: Positive for depression, suicidal ideas and substance abuse. The patient is nervous/anxious.   All other systems reviewed and are negative.   Blood pressure 104/54, pulse 67, temperature 97.9 F (36.6 C), temperature source Oral, resp. rate 18, height 5' (1.524 m), weight 94.348 kg (208 lb), last menstrual period 12/05/2014.Body mass index is 40.62 kg/(m^2).  General Appearance: Casual  Eye Contact::  Good  Speech:  Pressured  Volume:  Increased  Mood:  Dysphoric  Affect:  Labile  Thought Process:  Circumstantial  Orientation:  Full (Time, Place, and Person)  Thought Content:  Rumination  Suicidal Thoughts:  Yes.  with intent/plan  Homicidal Thoughts:  No  Memory:  Immediate;   Good Recent;   Good Remote;   Good  Judgement:  Impaired  Insight:  Lacking  Psychomotor Activity:  Increased  Concentration:  Good  Recall:  Good  Fund of Knowledge:Good  Language: Good  Akathisia:  No  Handed:  Right  AIMS (if  indicated):     Assets:  Communication Skills Desire for Improvement  ADL's:  Intact  Cognition: WNL  Sleep:      Risk to Self: Is patient at risk for suicide?: Yes What has been your use of drugs/alcohol within the last 12 months?: Two month relapse on crack cocaine which patient smokes daily; reports she will smoke "as much as I can afford, usually over a gram" Risk to Others:   Prior Inpatient Therapy:   Prior Outpatient Therapy:    Alcohol Screening: 1. How often do you have a drink containing alcohol?: Never 9. Have you or someone else been injured as a result of your drinking?: No 10. Has a relative or friend or a doctor or another health worker been concerned about your drinking or suggested you cut down?: No Alcohol Use Disorder Identification Test Final Score (AUDIT): 0 Brief Intervention: AUDIT score less than 7 or  less-screening does not suggest unhealthy drinking-brief intervention not indicated  Allergies:  No Known Allergies Lab Results:  Results for orders placed or performed during the hospital encounter of 02/04/15 (from the past 48 hour(s))  Urine rapid drug screen (hosp performed) (Not at [facility])     Status: Abnormal   Collection Time: 02/04/15  9:00 AM  Result Value Ref Range   Opiates NONE DETECTED NONE DETECTED   Cocaine POSITIVE (A) NONE DETECTED   Benzodiazepines NONE DETECTED NONE DETECTED   Amphetamines NONE DETECTED NONE DETECTED   Tetrahydrocannabinol NONE DETECTED NONE DETECTED   Barbiturates NONE DETECTED NONE DETECTED    Comment:        DRUG SCREEN FOR MEDICAL PURPOSES ONLY.  IF CONFIRMATION IS NEEDED FOR ANY PURPOSE, NOTIFY LAB WITHIN 5 DAYS.        LOWEST DETECTABLE LIMITS FOR URINE DRUG SCREEN Drug Class       Cutoff (ng/mL) Amphetamine      1000 Barbiturate      200 Benzodiazepine   128 Tricyclics       786 Opiates          300 Cocaine          300 THC              50   Pregnancy, urine     Status: None   Collection Time: 02/04/15   9:00 AM  Result Value Ref Range   Preg Test, Ur NEGATIVE NEGATIVE    Comment:        THE SENSITIVITY OF THIS METHODOLOGY IS >20 mIU/mL.   Comprehensive metabolic panel     Status: Abnormal   Collection Time: 02/04/15  9:32 AM  Result Value Ref Range   Sodium 136 135 - 145 mmol/L   Potassium 3.7 3.5 - 5.1 mmol/L   Chloride 104 101 - 111 mmol/L   CO2 25 22 - 32 mmol/L   Glucose, Bld 108 (H) 65 - 99 mg/dL   BUN 15 6 - 20 mg/dL   Creatinine, Ser 0.85 0.44 - 1.00 mg/dL   Calcium 8.7 (L) 8.9 - 10.3 mg/dL   Total Protein 7.3 6.5 - 8.1 g/dL   Albumin 3.4 (L) 3.5 - 5.0 g/dL   AST 25 15 - 41 U/L   ALT 21 14 - 54 U/L   Alkaline Phosphatase 85 38 - 126 U/L   Total Bilirubin 0.6 0.3 - 1.2 mg/dL   GFR calc non Af Amer >60 >60 mL/min   GFR calc Af Amer >60 >60 mL/min    Comment: (NOTE) The eGFR has been calculated using the CKD EPI equation. This calculation has not been validated in all clinical situations. eGFR's persistently <60 mL/min signify possible Chronic Kidney Disease.    Anion gap 7 5 - 15  Ethanol (ETOH)     Status: None   Collection Time: 02/04/15  9:32 AM  Result Value Ref Range   Alcohol, Ethyl (B) <5 <5 mg/dL    Comment:        LOWEST DETECTABLE LIMIT FOR SERUM ALCOHOL IS 5 mg/dL FOR MEDICAL PURPOSES ONLY   Salicylate level     Status: None   Collection Time: 02/04/15  9:32 AM  Result Value Ref Range   Salicylate Lvl <7.6 2.8 - 30.0 mg/dL  Acetaminophen level     Status: Abnormal   Collection Time: 02/04/15  9:32 AM  Result Value Ref Range   Acetaminophen (Tylenol), Serum <10 (L) 10 - 30 ug/mL  Comment:        THERAPEUTIC CONCENTRATIONS VARY SIGNIFICANTLY. A RANGE OF 10-30 ug/mL MAY BE AN EFFECTIVE CONCENTRATION FOR MANY PATIENTS. HOWEVER, SOME ARE BEST TREATED AT CONCENTRATIONS OUTSIDE THIS RANGE. ACETAMINOPHEN CONCENTRATIONS >150 ug/mL AT 4 HOURS AFTER INGESTION AND >50 ug/mL AT 12 HOURS AFTER INGESTION ARE OFTEN ASSOCIATED WITH TOXIC REACTIONS.    CBC     Status: None   Collection Time: 02/04/15  9:32 AM  Result Value Ref Range   WBC 8.2 4.0 - 10.5 K/uL   RBC 4.90 3.87 - 5.11 MIL/uL   Hemoglobin 14.1 12.0 - 15.0 g/dL   HCT 41.9 36.0 - 46.0 %   MCV 85.5 78.0 - 100.0 fL   MCH 28.8 26.0 - 34.0 pg   MCHC 33.7 30.0 - 36.0 g/dL   RDW 14.4 11.5 - 15.5 %   Platelets 245 150 - 400 K/uL   Current Medications: Current Facility-Administered Medications  Medication Dose Route Frequency Provider Last Rate Last Dose  . acetaminophen (TYLENOL) tablet 650 mg  650 mg Oral Q6H PRN Delfin Gant, NP      . alum & mag hydroxide-simeth (MAALOX/MYLANTA) 200-200-20 MG/5ML suspension 30 mL  30 mL Oral Q4H PRN Delfin Gant, NP      . cholecalciferol (VITAMIN D) tablet 1,000 Units  1,000 Units Oral Daily Merick Kelleher   1,000 Units at 02/05/15 1604  . feeding supplement (ENSURE ENLIVE) (ENSURE ENLIVE) liquid 237 mL  237 mL Oral BID BM [name] [name], MD   237 mL at 02/05/15 1605  . hydrOXYzine (ATARAX/VISTARIL) tablet 25 mg  25 mg Oral Q4H PRN Lurena Nida, NP   25 mg at 02/05/15 1217  . ibuprofen (ADVIL,MOTRIN) tablet 800 mg  800 mg Oral Q6H PRN Nicko Daher      . lamoTRIgine (LAMICTAL) tablet 25 mg  25 mg Oral Daily Charlisha Market   25 mg at 02/05/15 1605  . lurasidone (LATUDA) tablet 60 mg  60 mg Oral QHS Allyna Pittsley      . magnesium hydroxide (MILK OF MAGNESIA) suspension 30 mL  30 mL Oral Daily PRN Delfin Gant, NP   30 mL at 02/05/15 1414  . nicotine (NICODERM CQ - dosed in mg/24 hours) patch 21 mg  21 mg Transdermal Daily Jenne Campus, MD   21 mg at 02/05/15 1815  . pregabalin (LYRICA) capsule 25 mg  25 mg Oral BID Izekiel Flegel   25 mg at 02/05/15 1604  . rosuvastatin (CRESTOR) tablet 10 mg  10 mg Oral QHS Numa Heatwole       PTA Medications: Prescriptions prior to admission  Medication Sig Dispense Refill Last Dose  . CRESTOR 10 MG tablet Take 10 mg by mouth at bedtime.   2 More than a month at Unknown  time  . [employer] D3 1000 UNITS capsule Take 1,000 Units by mouth daily.  2 More than a month at Unknown time  . hydrOXYzine (ATARAX/VISTARIL) 50 MG tablet Take 50 mg by mouth 2 (two) times daily.  0 More than a month at Unknown time  . ibuprofen (ADVIL,MOTRIN) 200 MG tablet Take 800 mg by mouth every 6 (six) hours as needed for pain.   More than a month at Unknown time  . lamoTRIgine (LAMICTAL) 100 MG tablet Take 100 mg by mouth 2 (two) times daily.   0 More than a month at Unknown time  . LATUDA 60 MG TABS Take 1 tablet by mouth at bedtime.  0  More than a month at Unknown time  . LYRICA 200 MG capsule Take 200 mg by mouth 2 (two) times daily.  1 More than a month at Unknown time  . phentermine (ADIPEX-P) 37.5 MG tablet Take 37.5 mg by mouth daily.  2 More than a month at Unknown time    Previous Psychotropic Medications: Yes   Substance Abuse History in the last 12 months:  Yes.    Consequences of Substance Abuse: Family Consequences:  Family discord  Results for orders placed or performed during the hospital encounter of 02/04/15 (from the past 14 hour(s))  Urine rapid drug screen (hosp performed) (Not at [facility])     Status: Abnormal   Collection Time: 02/04/15  9:00 AM  Result Value Ref Range   Opiates NONE DETECTED NONE DETECTED   Cocaine POSITIVE (A) NONE DETECTED   Benzodiazepines NONE DETECTED NONE DETECTED   Amphetamines NONE DETECTED NONE DETECTED   Tetrahydrocannabinol NONE DETECTED NONE DETECTED   Barbiturates NONE DETECTED NONE DETECTED    Comment:        DRUG SCREEN FOR MEDICAL PURPOSES ONLY.  IF CONFIRMATION IS NEEDED FOR ANY PURPOSE, NOTIFY LAB WITHIN 5 DAYS.        LOWEST DETECTABLE LIMITS FOR URINE DRUG SCREEN Drug Class       Cutoff (ng/mL) Amphetamine      1000 Barbiturate      200 Benzodiazepine   381 Tricyclics       829 Opiates          300 Cocaine          300 THC              50   Pregnancy, urine     Status: None   Collection Time: 02/04/15  9:00 AM   Result Value Ref Range   Preg Test, Ur NEGATIVE NEGATIVE    Comment:        THE SENSITIVITY OF THIS METHODOLOGY IS >20 mIU/mL.   Comprehensive metabolic panel     Status: Abnormal   Collection Time: 02/04/15  9:32 AM  Result Value Ref Range   Sodium 136 135 - 145 mmol/L   Potassium 3.7 3.5 - 5.1 mmol/L   Chloride 104 101 - 111 mmol/L   CO2 25 22 - 32 mmol/L   Glucose, Bld 108 (H) 65 - 99 mg/dL   BUN 15 6 - 20 mg/dL   Creatinine, Ser 0.85 0.44 - 1.00 mg/dL   Calcium 8.7 (L) 8.9 - 10.3 mg/dL   Total Protein 7.3 6.5 - 8.1 g/dL   Albumin 3.4 (L) 3.5 - 5.0 g/dL   AST 25 15 - 41 U/L   ALT 21 14 - 54 U/L   Alkaline Phosphatase 85 38 - 126 U/L   Total Bilirubin 0.6 0.3 - 1.2 mg/dL   GFR calc non Af Amer >60 >60 mL/min   GFR calc Af Amer >60 >60 mL/min    Comment: (NOTE) The eGFR has been calculated using the CKD EPI equation. This calculation has not been validated in all clinical situations. eGFR's persistently <60 mL/min signify possible Chronic Kidney Disease.    Anion gap 7 5 - 15  Ethanol (ETOH)     Status: None   Collection Time: 02/04/15  9:32 AM  Result Value Ref Range   Alcohol, Ethyl (B) <5 <5 mg/dL    Comment:        LOWEST DETECTABLE LIMIT FOR SERUM ALCOHOL IS 5 mg/dL FOR MEDICAL PURPOSES ONLY  Salicylate level     Status: None   Collection Time: 02/04/15  9:32 AM  Result Value Ref Range   Salicylate Lvl <6.3 2.8 - 30.0 mg/dL  Acetaminophen level     Status: Abnormal   Collection Time: 02/04/15  9:32 AM  Result Value Ref Range   Acetaminophen (Tylenol), Serum <10 (L) 10 - 30 ug/mL    Comment:        THERAPEUTIC CONCENTRATIONS VARY SIGNIFICANTLY. A RANGE OF 10-30 ug/mL MAY BE AN EFFECTIVE CONCENTRATION FOR MANY PATIENTS. HOWEVER, SOME ARE BEST TREATED AT CONCENTRATIONS OUTSIDE THIS RANGE. ACETAMINOPHEN CONCENTRATIONS >150 ug/mL AT 4 HOURS AFTER INGESTION AND >50 ug/mL AT 12 HOURS AFTER INGESTION ARE OFTEN ASSOCIATED WITH TOXIC REACTIONS.   CBC      Status: None   Collection Time: 02/04/15  9:32 AM  Result Value Ref Range   WBC 8.2 4.0 - 10.5 K/uL   RBC 4.90 3.87 - 5.11 MIL/uL   Hemoglobin 14.1 12.0 - 15.0 g/dL   HCT 41.9 36.0 - 46.0 %   MCV 85.5 78.0 - 100.0 fL   MCH 28.8 26.0 - 34.0 pg   MCHC 33.7 30.0 - 36.0 g/dL   RDW 14.4 11.5 - 15.5 %   Platelets 245 150 - 400 K/uL    Observation Level/Precautions:  15 minute checks  Laboratory:  CBC Chemistry Profile UDS  Psychotherapy:  Individual and group sessions  Medications:  Medications will be started as appropriate for patients stabilization  Consultations:  Psychiatry  Discharge Concerns:  Safety, stabilization, and risk of access to medication and medication stabilization   Estimated LOS:  5-7 days  Other:     Psychological Evaluations: Yes   Treatment Plan Summary: Daily contact with patient to assess and evaluate symptoms and progress in treatment and Medication management  1. Admit for crisis management and stabilization 2. Medication management to reduce current symptoms to bale line and improve the patient's overall level of functioning 3. Treat health problems as indicated 4. Develop treatment plan to decrease risk of relapse upon discharge and the need for readmission. 5. Psycho-social education regarding relapse prevention and self care. 6. Health care follow up as needed for medical problems:   7. Restarted home medications where appropriate.  Started Latuda 60 mg at bed time for mood stabilization Vistaril 25 mg Q 4 hr prn anxiety;  Decreased Lamictal 25 mg daily for depression;  Lyrica 25 mg Bid for Fibromyalgia; Crestor for hyperlipidemia   Medical Decision Making:  Review of Psycho-Social Stressors (1), Review and summation of old records (2), Independent Review of image, tracing or specimen (2) and Review of Medication Regimen & Side Effects (2)  I certify that inpatient services furnished can reasonably be expected to improve the patient's condition.     Earleen Newport, FNP-BC 7/24/20168:41 PM Patient seen face-to-face for psychiatric evaluation, chart reviewed and case discussed with the physician extender and developed treatment plan. Reviewed the information documented and agree with the treatment plan. Corena Pilgrim, MD
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
## 2015-02-05 NOTE — [facility] Group Notes (Signed)
[facility] LCSW Group Therapy  02/05/2015 1:15 PM  Type of Therapy:  Group Therapy  Participation Level:  Active  Participation Quality:  Attentive, Intrusive, Redirectable and Sharing  Affect:  Excited  Cognitive:  Oriented  Insight:  Developing/Improving  Engagement in Therapy:  Engaged  Modes of Intervention:  Discussion, Education, Exploration, Socialization and Support  Summary of Progress/Problems: The main focus of today's process group was to identify the patient's current support system and decide on other supports that can be put in place. An emphasis was placed on using counselor, doctor, therapy groups, 12-step groups, and problem-specific support groups to expand supports. There was also an extensive discussion about what constitutes a healthy support versus an unhealthy support. Patient engaged easily in group discussion. She offered support to others and was able to process her feelings around positive supports. "You know I grew up with conflict and drama and calm positive people sometimes seem boring but I am willing to consider them as 'peaceful rather than boring', sounds different., feels different."   Sheilah Pigeon, LCSW
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
## 2015-02-05 NOTE — Tx Team (Signed)
Initial Interdisciplinary Treatment Plan   PATIENT STRESSORS: Financial difficulties Health problems Medication change or noncompliance Substance abuse   PATIENT STRENGTHS: Average or above average intelligence Capable of independent living General fund of knowledge Motivation for treatment/growth   PROBLEM LIST: Problem List/Patient Goals Date to be addressed Date deferred Reason deferred Estimated date of resolution  "med adjustments" 7/23   D/C  Substance abuse- cocaine 7/23     " wants to start back with NA"      "Suicdal thoughts"      Depression Anxiety                               DISCHARGE CRITERIA:  Improved stabilization in mood, thinking, and/or behavior Motivation to continue treatment in a less acute level of care Need for constant or close observation no longer present Reduction of life-threatening or endangering symptoms to within safe limits Verbal commitment to aftercare and medication compliance  PRELIMINARY DISCHARGE PLAN: Attend PHP/IOP Attend 12-step recovery group  PATIENT/FAMIILY INVOLVEMENT: This treatment plan has been presented to and reviewed with the patient, Mariangela Heldt.  The patient and family have been given the opportunity to ask questions and make suggestions.  Valeria Boza A 02/05/2015, 7:32 AM

## 2015-02-05 NOTE — Progress Notes (Signed)
D: Clyda was depressed and slightly irritable upon approach. She admitted SI but contracted for safety. She rated her sleep poor, appetite good, energy low, and concentration poor. She rated her depression an 8, hopelessness a 8, and anxiety a 6. She admitted some dizziness, with pain of a 6.  A: Meds given as ordered, including PRN Vistaril 2x for anxiety with some relief. Q15 safety checks maintained. Support/encouragement offered. R: Pt remains free from harm and continues with treatment. Will continue to monitor for needs/safety.

## 2015-02-05 NOTE — Progress Notes (Signed)
Adult Psychoeducational Group Note  Date:  02/05/2015 Time:  10:51 PM  Group Topic/Focus:  Wrap-Up Group:   The focus of this group is to help patients review their daily goal of treatment and discuss progress on daily workbooks.  Participation Level:  Active  Participation Quality:  Appropriate and Drowsy  Affect:  Appropriate  Cognitive:  Appropriate  Insight: Appropriate and Good  Engagement in Group:  Limited  Modes of Intervention:  Discussion  Additional Comments:  Patient rated her day a 1.5 Her goal is get better and stop doing drugs.  Donato Heinz 02/05/2015, 10:51 PM

## 2015-02-06 DIAGNOSIS — F314 Bipolar disorder, current episode depressed, severe, without psychotic features: Principal | ICD-10-CM

## 2015-02-06 LAB — URINALYSIS W MICROSCOPIC (NOT AT ARMC)
Bilirubin Urine: NEGATIVE
Glucose, UA: 100 mg/dL — AB
Ketones, ur: NEGATIVE mg/dL
Nitrite: NEGATIVE
PROTEIN: NEGATIVE mg/dL
Specific Gravity, Urine: 1.027 (ref 1.005–1.030)
UROBILINOGEN UA: 0.2 mg/dL (ref 0.0–1.0)
pH: 6 (ref 5.0–8.0)

## 2015-02-06 MED ORDER — TRAZODONE HCL 50 MG PO TABS
25.0000 mg | ORAL_TABLET | Freq: Every evening | ORAL | Status: DC | PRN
  Administered 2015-02-06 – 2015-02-08 (×3): 25 mg via ORAL
  Filled 2015-02-06: qty 2
  Filled 2015-02-06 (×3): qty 1

## 2015-02-06 MED ORDER — PREGABALIN 50 MG PO CAPS
50.0000 mg | ORAL_CAPSULE | Freq: Two times a day (BID) | ORAL | Status: DC
  Administered 2015-02-07: 50 mg via ORAL
  Filled 2015-02-06: qty 1

## 2015-02-06 NOTE — Progress Notes (Signed)
Patient ID: Patricia Hall, female   DOB: 1966-06-28, 49 y.o.   MRN: [id number] PER STATE REGULATIONS 482.30  THIS CHART WAS REVIEWED FOR MEDICAL NECESSITY WITH RESPECT TO THE PATIENT'S ADMISSION/DURATION OF STAY.  NEXT REVIEW DATE:02/08/15  Roma Schanz, RN, BSN CASE MANAGER

## 2015-02-06 NOTE — Progress Notes (Signed)
Recreation Therapy Notes  Date: 07.25.16 Time: 9:30 am Location: [address] Group Room  Group Topic: Stress Management  Goal Area(s) Addresses:  Patient will verbalize importance of using healthy stress management.  Patient will identify positive emotions associated with healthy stress management.   Intervention: Stress Management  Activity :  Progressive Muscle Relaxation.  LRT introduced and educated patients on stress management technique of progressive muscle relaxation.  A script was used to deliver the technique to patients.  Patients were asked to follow script read allowed by LRT to engage in practicing the stress management technique.  Education:  Stress Management, Discharge Planning.   Education Outcome: Acknowledges edcuation/In group clarification offered/Needs additional education  Clinical Observations/Feedback: Patient did not attend group.   Victorino Sparrow, LRT/CTRS         Ria Comment, Tenia Goh A 02/06/2015 1:25 PM

## 2015-02-06 NOTE — Plan of Care (Signed)
Problem: Ineffective individual coping Goal: STG: Patient will remain free from self harm Outcome: Progressing Patient endorsing passive SI but has not engaged in self harm.  Problem: Alteration in mood; excessive anxiety as evidenced by: Goal: STG-Pt will report an absence of self-harm thoughts/actions (Patient will report an absence of self-harm thoughts or actions)  Outcome: Progressing Patient openly discusses current passive SI but contracts verbally.

## 2015-02-06 NOTE — Progress Notes (Signed)
[facility] MD Progress Note  02/06/2015 9:30 PM Patricia Hall  MRN:  [id number] Subjective:  Patient reports she is feeling " a little better", but still far from her baseline- reports ongoing depression and ruminations about recently diagnosed Fibromyalgia. Objective : Case reviewed with Treatment Team and patient seen . Patient states she had been doing well and had been sober for a period of almost three years, but had started having significant pains and aches over recent weeks to months , which disrupted her normally active life style. States she was told she had fibromyalgia and that she could eventually end up " in a wheel chair", which caused her to feel very depressed and to relapse on cocaine . States that at this time she is feeling " a little bit better". At this time denies medication side effects. States current Lyrica dose much lower than what she was taking at home.  She is active and visible on unit , has been going to groups- no disruptive  Behaviors . U/A (+) trichomonas  Principal Problem: Bipolar disorder, current episode depressed, severe, without psychotic features Diagnosis:   Patient Active Problem List   Diagnosis Date Noted  . Bipolar disorder, current episode depressed, severe, without psychotic features [F31.4] 02/04/2015  . Bipolar affective disorder, depressed [F31.30] 02/04/2015  . Depression [F32.9]   . Suicide ideation [R45.851]   . Paresthesia [R20.2] 09/01/2014  . Chronic daily headache [R51] 09/01/2014  . Acute cholecystitis [K81.0] 05/12/2012   Total Time spent with patient: 20 minutes   Past Medical History:  Past Medical History  Diagnosis Date  . Hep C w/ coma, chronic   . Bipolar 1 disorder   . Substance abuse   . Cholecystitis 05/12/2012  . Paresthesia 09/01/2014  . Obese   . Hyperlipemia   . Vitamin D deficiency   . Substance abuse     cocaine  . Chronic daily headache 09/01/2014    Past Surgical History  Procedure Laterality Date  .  Appendectomy    . Eye surgery      strabismus surgery  . Cholecystectomy  05/12/2012    Procedure: LAPAROSCOPIC CHOLECYSTECTOMY WITH INTRAOPERATIVE CHOLANGIOGRAM;  Surgeon: Ralene Ok, MD;  Location: [facility];  Service: General;  Laterality: N/A;   Family History:  Family History  Problem Relation Age of Onset  . Heart disease Mother   . Diabetes Mother   . Diabetes Father   . Heart disease Father   . Diabetes Sister   . Diabetes Brother    Social History:  History  Alcohol Use No    Comment: Patient denies      History  Drug Use  . Yes  . Special: "Crack" cocaine    Comment: daily cocaine use. as much as I can get.     History   Social History  . Marital Status: Legally Separated    Spouse Name: N/A  . Number of Children: N/A  . Years of Education: N/A   Social History Main Topics  . Smoking status: Current Every Day Smoker -- 0.50 packs/day for 25 years    Types: Cigarettes  . Smokeless tobacco: Never Used  . Alcohol Use: No     Comment: Patient denies   . Drug Use: Yes    Special: "Crack" cocaine     Comment: daily cocaine use. as much as I can get.   Marland Kitchen Sexual Activity: Yes    Birth Control/ Protection: Condom   Other Topics Concern  . None  Social History Narrative   Additional History:    Sleep: fair   Appetite:  Improved    Assessment:   Musculoskeletal: Strength & Muscle Tone: within normal limits Gait & Station:  States gait is made difficult by pain Patient leans: N/A   Psychiatric Specialty Exam: Physical Exam  ROS describes chronic pain, worse on lower extremities   Blood pressure 85/53, pulse 64, temperature 97.5 F (36.4 C), temperature source Oral, resp. rate 18, height 5' (1.524 m), weight 208 lb (94.348 kg), last menstrual period 12/05/2014.Body mass index is 40.62 kg/(m^2).  General Appearance: Fairly Groomed  Engineer, water::  Good  Speech:  Normal Rate  Volume:  Normal  Mood:  Depressed- but improved compared to admission   Affect:  Constricted and but reactive   Thought Process:  Linear  Orientation:  Other:  fully alert and attentive   Thought Content:  Rumination and denies hallucinations, no delusions, not internally preoccupied   Suicidal Thoughts:  No  Homicidal Thoughts:  No  Memory:  recent and remote grossly intact   Judgement:  Fair  Insight:  Fair  Psychomotor Activity:  Normal  Concentration:  Good  Recall:  Good  Fund of Knowledge:Good  Language: Good  Akathisia:  Negative  Handed:  Right  AIMS (if indicated):     Assets:  Desire for Improvement Resilience  ADL's:  Impaired  Cognition: WNL  Sleep:        Current Medications: Current Facility-Administered Medications  Medication Dose Route Frequency Provider Last Rate Last Dose  . acetaminophen (TYLENOL) tablet 650 mg  650 mg Oral Q6H PRN Delfin Gant, NP      . alum & mag hydroxide-simeth (MAALOX/MYLANTA) 200-200-20 MG/5ML suspension 30 mL  30 mL Oral Q4H PRN Delfin Gant, NP      . cholecalciferol (VITAMIN D) tablet 1,000 Units  1,000 Units Oral Daily Mojeed Akintayo   1,000 Units at 02/06/15 0814  . feeding supplement (ENSURE ENLIVE) (ENSURE ENLIVE) liquid 237 mL  237 mL Oral BID BM [name] [name], MD   237 mL at 02/05/15 1605  . hydrOXYzine (ATARAX/VISTARIL) tablet 25 mg  25 mg Oral Q4H PRN Lurena Nida, NP   25 mg at 02/05/15 1217  . ibuprofen (ADVIL,MOTRIN) tablet 800 mg  800 mg Oral Q6H PRN Mojeed Akintayo      . lamoTRIgine (LAMICTAL) tablet 25 mg  25 mg Oral Daily Mojeed Akintayo   25 mg at 02/06/15 0814  . lurasidone (LATUDA) tablet 60 mg  60 mg Oral QHS Mojeed Akintayo   60 mg at 02/06/15 2128  . magnesium hydroxide (MILK OF MAGNESIA) suspension 30 mL  30 mL Oral Daily PRN Delfin Gant, NP   30 mL at 02/05/15 1414  . nicotine (NICODERM CQ - dosed in mg/24 hours) patch 21 mg  21 mg Transdermal Daily Jenne Campus, MD   21 mg at 02/06/15 0800  . [START ON 02/07/2015] pregabalin (LYRICA) capsule 50 mg   50 mg Oral BID [name] [name], MD      . rosuvastatin (CRESTOR) tablet 10 mg  10 mg Oral QHS Mojeed Akintayo   10 mg at 02/06/15 2128  . traZODone (DESYREL) tablet 25 mg  25 mg Oral QHS PRN Jenne Campus, MD   25 mg at 02/06/15 2128    Lab Results:  Results for orders placed or performed during the hospital encounter of 02/04/15 (from the past 48 hour(s))  Urinalysis with microscopic  Status: Abnormal   Collection Time: 02/06/15  6:28 PM  Result Value Ref Range   Color, Urine YELLOW YELLOW   APPearance CLOUDY (A) CLEAR   Specific Gravity, Urine 1.027 1.005 - 1.030   pH 6.0 5.0 - 8.0   Glucose, UA 100 (A) NEGATIVE mg/dL   Hgb urine dipstick SMALL (A) NEGATIVE   Bilirubin Urine NEGATIVE NEGATIVE   Ketones, ur NEGATIVE NEGATIVE mg/dL   Protein, ur NEGATIVE NEGATIVE mg/dL   Urobilinogen, UA 0.2 0.0 - 1.0 mg/dL   Nitrite NEGATIVE NEGATIVE   Leukocytes, UA SMALL (A) NEGATIVE   WBC, UA 7-10 <3 WBC/hpf   Squamous Epithelial / LPF MANY (A) RARE   Urine-Other TRICHOMONAS PRESENT     Comment: Performed at [facility]    Physical Findings: AIMS:  , ,  ,  ,    CIWA:    COWS:      Assessment- patient reports partial improvement of mood, but remains depressed and ruminative about fibromyalgia diagnosis, pain, and how it affects her ability to function. Also ruminative about relapse on cocaine. Tolerating medications well at this time  Treatment Plan Summary: Daily contact with patient to assess and evaluate symptoms and progress in treatment, Medication management, Plan inpatient admission and medicaitons as below  Continue Lyrica at 50 mgrs BID to address chronic pain Continue Latuda  60 mgrs QHS as mood stabilizer for mood disorder Continue Lamictal 25 mgrs QDAY for depression Hydroxyzine PRNs for Anxiety as needed  Will discuss Trichomonas management with patient. Trazdone 25 mgrs QHS PRN for insomnia .   Medical Decision Making:  Established Problem,  Stable/Improving (1), Review of Psycho-Social Stressors (1), Review or order clinical lab tests (1) and Review of Medication Regimen & Side Effects (2)     [name], FERNANDO 02/06/2015, 9:30 PM

## 2015-02-06 NOTE — Progress Notes (Signed)
[facility] Group Notes:  (Nursing/MHT/Case Management/Adjunct)  Date:  02/06/2015  Time:  11:55 PM  Type of Therapy:  Psychoeducational Skills  Participation Level:  Active  Participation Quality:  Appropriate  Affect:  Appropriate  Cognitive:  Appropriate  Insight:  Appropriate  Engagement in Group:  Engaged  Modes of Intervention:  Discussion  Summary of Progress/Problems: Tonight in wrap up group Danasha stated that her leg pain has affected her day some, she still rated her day 8.6 because she got some visitors and meds were able to help her.  Jeanette Caprice 02/06/2015, 11:55 PM

## 2015-02-06 NOTE — [facility] Group Notes (Signed)
[facility] LCSW Group Therapy  02/06/2015 1:15pm  Type of Therapy:  Group Therapy vercoming Obstacles  Participation Level:  Active  Participation Quality:  Appropriate   Affect:  Appropriate  Cognitive:  Appropriate and Oriented  Insight:  Developing/Improving and Improving  Engagement in Therapy:  Improving  Modes of Intervention:  Discussion, Exploration, Problem-solving and Support  Description of Group:   In this group patients will be encouraged to explore what they see as obstacles to their own wellness and recovery. They will be guided to discuss their thoughts, feelings, and behaviors related to these obstacles. The group will process together ways to cope with barriers, with attention given to specific choices patients can make. Each patient will be challenged to identify changes they are motivated to make in order to overcome their obstacles. This group will be process-oriented, with patients participating in exploration of their own experiences as well as giving and receiving support and challenge from other group members.  Summary of Patient Progress: Pt engaged appropriately in group discussion and presented as motivated AEB asking clarifying questions and seeking advice regarding ways to change her negative thinking. Pt reports that substance abuse and low self-esteem are her main obstacles. Pt was receptive to suggestions given by peers and CSW.    Therapeutic Modalities:   Cognitive Behavioral Therapy Solution Focused Therapy Motivational Interviewing Relapse Prevention Therapy   Peri Maris, LCSWA 02/06/2015 5:29 PM

## 2015-02-06 NOTE — Progress Notes (Signed)
D: Pt endorses SI, depression, and anxiety. Pt is able to verbally contract for safety at this time. Pt observed interacting with others in the dayroom. Pt is compliant with her current POC.   A: Writer administered scheduled medications to pt, per MD orders. Continued support and availability as needed was extended to this pt. Staff continue to monitor pt with q11min checks.  R: No adverse drug reactions noted. Pt receptive to treatment. Pt remains safe at this time.

## 2015-02-06 NOTE — [facility] Group Notes (Signed)
[facility] LCSW Aftercare Discharge Planning Group Note  02/06/2015 8:45 AM  Participation Quality: Alert, Appropriate and Oriented  Mood/Affect: Appropriate  Depression Rating: 4  Anxiety Rating: 2  Thoughts of Suicide: Pt endorses passive SI  Will you contract for safety? Yes  Current AVH: Pt denies  Plan for Discharge/Comments: Pt attended discharge planning group and actively participated in group. CSW discussed suicide prevention education with the group and encouraged them to discuss discharge planning and any relevant barriers. Pt reports that she is feeling better than when she arrived. Requests a journal and to have appointment with [facility] rescheduled.  Transportation Means: Pt reports access to transportation  Supports: No supports mentioned at this time  Peri Maris, LCSWA 02/06/2015 1:25 PM

## 2015-02-06 NOTE — Progress Notes (Addendum)
Patient up and visible in milieu. In scrubs and somewhat disheveled. Has frequent requests of staff, restless, anxious. Talking with peers in [place]. Reports to this writer, "I haven't decided if I want to live or die yet. But I won't do anything in here. I promise you." Rates depression and hopelessness at a 4/10 and anxiety at a 5/10. Patient offered support, reassurance. Medicated per orders. States her fibromyalgia pain is at a 6/10 and lyrica given per scheduled AM meds. On reassess, pain is a 4/10. Patient denies HI/AVH and remains safe. Jamie Kato

## 2015-02-07 MED ORDER — PREGABALIN 75 MG PO CAPS
75.0000 mg | ORAL_CAPSULE | Freq: Two times a day (BID) | ORAL | Status: DC
  Administered 2015-02-07 – 2015-02-09 (×4): 75 mg via ORAL
  Filled 2015-02-07 (×4): qty 1

## 2015-02-07 NOTE — [facility] Suicide Risk Assessment (Signed)
[facility] INPATIENT:  Family/Significant Other Suicide Prevention Education  Suicide Prevention Education:  Education Completed; Verdie Shire., Pt's friend [phone],  has been identified by the patient as the family member/significant other with whom the patient will be residing, and identified as the person(s) who will aid the patient in the event of a mental health crisis (suicidal ideations/suicide attempt).  With written consent from the patient, CSW offered SPE to [place]; however, Kennyth Lose expressed that she feels that she has a good understanding of suicide risk factors and warning signs and feels that she does not need any further education. SPE reviewed with patient and brochure provided. Patient encouraged to return to hospital if having suicidal thoughts, patient verbalized his/her understanding and has no further questions at this time.   Bo Mcclintock 02/07/2015, 9:11 AM

## 2015-02-07 NOTE — Progress Notes (Addendum)
Pt attended spiritual care group on grief and loss facilitated by chaplain Jerene Pitch. Group opened with brief discussion and psycho-social ed around grief and loss in relationships and in relation to self - identifying life patterns, circumstances, changes that cause losses. Established group norm of speaking from own life experience. Group goal of establishing open and affirming space for members to share loss and experience with grief, normalize grief experience and provide psycho social education and grief support.  Group drew on narrative and Adlerian therapeutic modalities.    Katharine Look [facility]) detailed that she does not often speak with others about her pain / grief.  She expressed she has been in intense physical pain, emotional pain from changes in her capacity due to illness (she cannot walk well), and has been using substances to cope with these.  She expressed fear around discharging, as she feels safe at [facility], but is uncertain about what she will feel at home.  Received support from other group members who expressed their appreciation for her sharing with them.  Group spoke about importance of finding spaces to share burden and tension that many members feel around "burdening others."  Group was able to reinforce that the members did not feel burdened, but rather felt more supported that this sharing was possible.    In supporting other group members around coping skills, Sandi described writing as a valuable outlet.   [place], [place]

## 2015-02-07 NOTE — Tx Team (Addendum)
Interdisciplinary Treatment Plan Update (Adult) Date: 02/07/2015   Date: 02/07/2015 1:12 PM  Progress in Treatment:  Attending groups: Yes  Participating in groups: Yes  Taking medication as prescribed: Yes  Tolerating medication: Yes  Family/Significant othe contact made: Yes, with friend Patricia Hall Patient understands diagnosis: Yes Discussing patient identified problems/goals with staff: Yes  Medical problems stabilized or resolved: Yes  Denies suicidal/homicidal ideation: No, passive SI but will contract for safety Patient has not harmed self or Others: Yes   New problem(s) identified: None identified at this time.   Discharge Plan or Barriers: Pt is considering substance abuse rehab at this time but is still deciding. Pt is established with [facility] and has her own apartment.  Additional comments:  Pt is 49 YO separated disabled caucasian female admitted with diagnosis of Bipolar Disorder, Depressed. Patient requesting detox after 2 month relapse smoking crack cocaine on daily basis and being off psych medications. Patient uses bus and or medicaid for transportation needs. Has her own apartment which she can return top and hopes to return back to providers at [facility] where she sees PA Wells Guiles for [employer] and Marlowe Kays for therapy   Reason for Continuation of Hospitalization:  Depression Medication stabilization Suicidal ideation Withdrawal symptoms  Estimated length of stay: 2-3 days  Review of initial/current patient goals per problem list:   1.  Goal(s): Patient will participate in aftercare plan  Met:  Yes  Target date: 02/09/15  As evidenced by: Patient will participate within aftercare plan AEB aftercare provider and housing plan at discharge being identified.   7/26: Pt has her own apartment that she will return to if she decides against residential substance abuse treatment. Pt is established with [facility].  02/09/15: Pt will  return to her apartment and continue following-up with [facility] and the IOP program at [facility].  2.  Goal (s): Patient will exhibit decreased depressive symptoms and suicidal ideations.  Met:  Yes  Target date: 02/10/15  As evidenced by: Patient will utilize self rating of depression at 3 or below and demonstrate decreased signs of depression or be deemed stable for discharge by MD.  7/26: Pt continues to rate depression highly and endorse passive SI.  7/28: Pt denies SI and rates depression at 3/10.  3.  Goal(s): Patient will demonstrate decreased signs and symptoms of anxiety.  Met:  Yes  Target date: 02/10/15  As evidenced by: Patient will utilize self rating of anxiety at 3 or below and demonstrated decreased signs of anxiety, or be deemed stable for discharge by MD  7/26: Pt continues to rate anxiety highly and exhibit anxious affect and continued concern about her discharge date.  7/28: Pt reports decreased anxiety with symptoms being manageable.  4.  Goal(s): Patient will demonstrate decreased signs of withdrawal due to substance abuse  Met:  Yes  Target date: 02/07/15  As evidenced by: Patient will produce a CIWA/COWS score of 0, have stable vitals signs, and no symptoms of withdrawal  7/26: Pt has COWS score of 0 and CIWA score of 1 endorsing anxiety. Pt withdrawal symptoms adequate for discharge  Attendees:  Patient:    Family:    Physician: Dr. Parke Poisson, MD  02/07/2015 1:12 PM  Nursing: Lars Pinks, RN Case manager  02/07/2015 1:12 PM  Clinical Social Worker Peri Maris, Latanya Presser, MSW 02/07/2015 1:12 PM  Other: Lucinda Dell, Beverly Sessions Liasion 02/07/2015 1:12 PM  Clinical:  Grayland Ormond, RN; Gaylan Gerold,  RN 02/07/2015 1:12 PM  Other: , RN Charge Nurse 02/07/2015 1:12 PM  Other:     Peri Maris, [place] MSW

## 2015-02-07 NOTE — Progress Notes (Signed)
D:  Patient's self inventory sheet, patient sleeps good, sleep medication is helpful.  Good appetite, low energy level, poor concentration.  Rated depression 4, hopeless 7, anxiety 8.  Withdrawals, chilling, cravings, agitation, nausea, irritability.  SI off/on, contracts for safety.  Physical problems, lightheaded, pain, dizziness, headaches.  Physical pain, worst in past 24 hours, #9, back, legs, shoulders.  Medication is not helpful.  Goal is to get medications regulated.  Plans to discuss medications with MD.  Does have discharge plans.  Problems anticipated after discharge are financial, transportation.   A:  Medications administered per MD order.  Emotional support and encouragement given patient. R:  Denied SI and HI this morning while talking to nurse.  Denied A/V hallucinations.  Safety maintained with 15 minute checks.

## 2015-02-07 NOTE — [facility] Group Notes (Addendum)
[facility] Group Notes:  (Nursing/MHT/Case Management/Adjunct)  Date:  02/07/2015  Time:  0900  Type of Therapy:  Psychoeducational Skills  Participation Level:  Active  Participation Quality:  Appropriate  Affect:  Appropriate  Cognitive:  Appropriate  Insight:  Appropriate  Engagement in Group:  Supportive  Modes of Intervention:  Discussion  Summary of Progress/Problems:  Patient actively participated in group.  The purpose of this group is to discuss the topic of the day which is Recovery. Patients did a Recovery activity and patient's were encouraged to fill it out. Patient discussed her goals with group.     Cammy Copa 02/07/2015, 9:49 AM
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
## 2015-02-07 NOTE — Progress Notes (Signed)
Recreation Therapy Notes  Animal-Assisted Activity (AAA) Program Checklist/Progress Notes Patient Eligibility Criteria Checklist & Daily Group note for Rec Tx Intervention  Date: 07.26.16 Time: 2:45 pm Location: [address]  AAA/T Program Assumption of Risk Form signed by Patient/ or Parent Legal Guardian yes  Patient is free of allergies or sever asthma yes  Patient reports no fear of animals yes  Patient reports no history of cruelty to animalsyes  Patient understands his/her participation is voluntary yes  Patient washes hands before animal contact yes  Patient washes hands after animal contact yes  Education: Hand Washing, Appropriate Animal Interaction   Education Outcome: Acknowledges understanding/In group clarification offered/Needs additional education.   Clinical Observations/Feedback:  Patient did not attend group.   Victorino Sparrow, LRT/CTRS         Victorino Sparrow A 02/07/2015 4:03 PM

## 2015-02-07 NOTE — Evaluation (Signed)
Physical Therapy Evaluation Patient Details Name: Patricia Hall MRN: [id number] DOB: 02-Jan-1966 Today's Date: 02/07/2015   History of Present Illness  49 yo female admitted with biploar d/o. Hx of Hep C, bipolar d/o, substance abuse, fibromyalgia, obesity.   Clinical Impression  On eval, pt was Mod Ind with mobility-walked ~100 feet with RW. Pt rated bil LEs pain as 6/10.  Encouraged pt to remain as active as possible. Reviewed some LE ROM exercises (see below) for pt to perform as tolerated. Pt reports she already has RW, cane at home. Encouraged continued use of walker as well. Will sign off. Thanks.     Follow Up Recommendations No PT follow up    Equipment Recommendations  None recommended by PT    Recommendations for Other Services       Precautions / Restrictions Precautions Precautions: None Restrictions Weight Bearing Restrictions: No      Mobility  Bed Mobility               General bed mobility comments: pt oob in dayroom  Transfers Overall transfer level: Independent                  Ambulation/Gait Ambulation/Gait assistance: Modified independent (Device/Increase time) Ambulation Distance (Feet): 100 Feet Assistive device: Rolling walker (2 wheeled) Gait Pattern/deviations: Step-through pattern     General Gait Details: No LOB or unsteadiness noted with use of walker  Stairs            Wheelchair Mobility    Modified Rankin (Stroke Patients Only)       Balance Overall balance assessment: Needs assistance         Standing balance support: During functional activity Standing balance-Leahy Scale: Fair                               Pertinent Vitals/Pain Pain Assessment: 0-10 Pain Score: 6  Pain Location: LEs Pain Descriptors / Indicators: Aching;Sore Pain Intervention(s): Limited activity within patient's tolerance;Monitored during session    [place] expects to be discharged to:: Private  residence Living Arrangements: Alone   Type of Home: House         Home Equipment: Gilford Rile - 2 wheels;Cane - single point      Prior Function Level of Independence: Independent with assistive device(s)               Hand Dominance        Extremity/Trunk Assessment   Upper Extremity Assessment: Overall WFL for tasks assessed           Lower Extremity Assessment: Generalized weakness      Cervical / Trunk Assessment: Normal  Communication   Communication: No difficulties  Cognition Arousal/Alertness: Awake/alert Behavior During Therapy: WFL for tasks assessed/performed Overall Cognitive Status: Within Functional Limits for tasks assessed                      General Comments      Exercises General Exercises - Lower Extremity Ankle Circles/Pumps: AROM;Both;10 reps;Seated Long Arc Quad: AROM;Both;10 reps;Seated Hip Flexion/Marching: AROM;Both;10 reps;Standing      Assessment/Plan    PT Assessment Patent does not need any further PT services  PT Diagnosis     PT Problem List    PT Treatment Interventions     PT Goals (Current goals can be found in the Care Plan section) Acute Rehab PT Goals Patient Stated Goal: less pain PT  Goal Formulation: All assessment and education complete, DC therapy    Frequency     Barriers to discharge        Co-evaluation               End of Session   Activity Tolerance: Patient tolerated treatment well Patient left: in chair (in dayroom)      Functional Assessment Tool Used: clinical judgement Functional Limitation: Mobility: Walking and moving around Mobility: Walking and Moving Around Current Status (P5465): At least 1 percent but less than 20 percent impaired, limited or restricted Mobility: Walking and Moving Around Goal Status [phone]): At least 1 percent but less than 20 percent impaired, limited or restricted Mobility: Walking and Moving Around Discharge Status [phone]): At least 1 percent  but less than 20 percent impaired, limited or restricted    Time: 1315-1327 PT Time Calculation (min) (ACUTE ONLY): 12 min   Charges:   PT Evaluation $Initial PT Evaluation Tier I: 1 Procedure     PT G Codes:   PT G-Codes **NOT FOR INPATIENT CLASS** Functional Assessment Tool Used: clinical judgement Functional Limitation: Mobility: Walking and moving around Mobility: Walking and Moving Around Current Status (F7494): At least 1 percent but less than 20 percent impaired, limited or restricted Mobility: Walking and Moving Around Goal Status [phone]): At least 1 percent but less than 20 percent impaired, limited or restricted Mobility: Walking and Moving Around Discharge Status [phone]): At least 1 percent but less than 20 percent impaired, limited or restricted    Weston Anna, MPT Pager: [phone]

## 2015-02-07 NOTE — Progress Notes (Signed)
D: Pt reports having a difficult day due to her leg pain. Pt was informed that she was ordered a PT evaluation. Pt verbalized that the walker has provided some relief with her difficulty with walking. Pt was unable to directly answer on whether she was having SI today. " I really don't know". Pt was able to verbally contract for safety with Probation officer. Pt feels that she may relapse if she is discharged at this time. Pt was visible and active within the milieu. Pt is complaint with her current POC.  A: Writer administered scheduled and prn medications to pt. Continued support and availability as needed was extended to this pt. Staff continue to monitor pt with q76min checks.  R: No adverse drug reactions noted. Pt receptive to treatment. Pt remains safe at this time.

## 2015-02-07 NOTE — Plan of Care (Signed)
Problem: Consults Goal: Depression Patient Education See Patient Education Module for education specifics.  Outcome: Progressing Nurse discuss depression/coping skills with patient.

## 2015-02-07 NOTE — Progress Notes (Signed)
[facility] Group Notes:  (Nursing/MHT/Case Management/Adjunct)  Date:  02/07/2015  Time:  9:17 PM  Type of Therapy:  Psychoeducational Skills  Participation Level:  Active  Participation Quality:  Appropriate  Affect:  Appropriate  Cognitive:  Appropriate  Insight:  Good  Engagement in Group:  Engaged  Modes of Intervention:  Discussion  Summary of Progress/Problems: Tonight in wrap up group Leta said that her day was a 8 or 9 she was in a really good mood said she hasnt laughed like this in a long time.  Jeanette Caprice 02/07/2015, 9:17 PM

## 2015-02-07 NOTE — Progress Notes (Signed)
UA COMPLETED AND PUT IN LAB REFRIGERATOR FOR PICK UP.

## 2015-02-07 NOTE — B[facility] Group Notes (Signed)
[place] LCSW Group Therapy 02/07/2015 1:15 PM  Type of Therapy: Group Therapy- Feelings about Diagnosis  Participation Level: Active   Participation Quality:  Appropriate  Affect:  Appropriate  Cognitive: Alert and Oriented   Insight:  Developing   Engagement in Therapy: Developing/Improving and Engaged   Modes of Intervention: Clarification, Confrontation, Discussion, Education, Exploration, Limit-setting, Orientation, Problem-solving, Rapport Building, Art therapist, Socialization and Support  Description of Group:   This group will allow patients to explore their thoughts and feelings about diagnoses they have received. Patients will be guided to explore their level of understanding and acceptance of these diagnoses. Facilitator will encourage patients to process their thoughts and feelings about the reactions of others to their diagnosis, and will guide patients in identifying ways to discuss their diagnosis with significant others in their lives. This group will be process-oriented, with patients participating in exploration of their own experiences as well as giving and receiving support and challenge from other group members.  Therapeutic Modalities:   Cognitive Behavioral Therapy Solution Focused Therapy Motivational Interviewing Relapse Prevention Therapy  Peri Maris, LCSWA 02/07/2015 5:16 PM

## 2015-02-08 LAB — GC/CHLAMYDIA PROBE AMP (~~LOC~~) NOT AT ARMC
CHLAMYDIA, DNA PROBE: NEGATIVE
Neisseria Gonorrhea: NEGATIVE

## 2015-02-08 MED ORDER — METRONIDAZOLE 500 MG PO TABS
2000.0000 mg | ORAL_TABLET | Freq: Once | ORAL | Status: AC
  Administered 2015-02-08: 2000 mg via ORAL
  Filled 2015-02-08: qty 8
  Filled 2015-02-08: qty 4

## 2015-02-08 NOTE — Plan of Care (Signed)
Problem: Alteration in mood Goal: LTG-Pt's behavior demonstrates decreased signs of depression (Patient's behavior demonstrates decreased signs of depression to the point the patient is safe to return home and continue treatment in an outpatient setting)  Outcome: Progressing Client demonstrates decreased signs of depression AEB noting "I'm going home when I leave here, they want me to go to a 30 day program, but I can't leave my cats that long" "I'll go to outpatient therapy" "I don't want to hurt myself, "I'm getting better"

## 2015-02-08 NOTE — Progress Notes (Signed)
D:  Per pt self inventory pt reports sleeping poor, appetite good, energy level low, ability to pay attention poor, rates depression at a 4 out of 10, hopelessness at a 9 out of 10, anxiety at an 8 out of 10, denies HI/AVH, endorses SI on and off, contracts for safety, goal today: "regulate my meds, and become balanced, not all over the place"    A:  Emotional support provided, Encouraged pt to continue with treatment plan and attend all group activities, q15 min checks maintained for safety.  R:  Pt is hyper verbal and intrusive at times, receptive, going to groups, participates, pleasant and cooperative with staff and other patients on the unit, interactive in the milieu.

## 2015-02-08 NOTE — BHH Group Notes (Signed)
[place] LCSW Group Therapy 02/08/2015 1:15 PM  Type of Therapy: Group Therapy- Emotion Regulation  Participation Level: Active   Participation Quality:  Appropriate  Affect: Appropriate  Cognitive: Alert and Oriented   Insight:  Developing/Improving  Engagement in Therapy: Developing/Improving and Engaged   Modes of Intervention: Clarification, Confrontation, Discussion, Education, Exploration, Limit-setting, Orientation, Problem-solving, Rapport Building, Art therapist, Socialization and Support  Summary of Progress/Problems: The topic for group today was emotional regulation. This group focused on both positive and negative emotion identification and allowed group members to process ways to identify feelings, regulate negative emotions, and find healthy ways to manage internal/external emotions. Group members were asked to reflect on a time when their reaction to an emotion led to a negative outcome and explored how alternative responses using emotion regulation would have benefited them. Group members were also asked to discuss a time when emotion regulation was utilized when a negative emotion was experienced. Pt was active in group discussion, identifying strategies to manage anger and depression. Pt focused on replacing negative thoughts with positive thoughts as well as changing her environment and relationships.   Patricia Hall, [place] 02/08/2015 4:38 PM

## 2015-02-08 NOTE — Progress Notes (Addendum)
Patient ID: Patricia Hall, female   DOB: Jul 04, 1966, 49 y.o.   MRN: [id number] [facility] MD Progress Note  02/08/2015 3:23 PM Patricia Hall  MRN:  [id number] Subjective:  Patient states she is feeling better, less depressed, " more positive".  States " I am really missing my pets ". Denies any cravings for drugs at this time. Denies any medication side effects.  Objective : Case reviewed with Treatment Team and patient seen . Patient  Continues to improve, and today states she is feeling better, more optimistic, " having more positive thoughts, I want to be happy, and not by using drugs, I do not want to use anymore". She has been going to groups, is visible in dayroom. Behavior in good control.  States energy level is improving, appetite is good, sleep remains fair . Denies any medication side effects at the present time . No disruptive behaviors on unit. Labs reviewed - Chlamydia/Gonorrhea screen negative .  Principal Problem: Bipolar disorder, current episode depressed, severe, without psychotic features Diagnosis:   Patient Active Problem List   Diagnosis Date Noted  . Bipolar disorder, current episode depressed, severe, without psychotic features [F31.4] 02/04/2015  . Bipolar affective disorder, depressed [F31.30] 02/04/2015  . Depression [F32.9]   . Suicide ideation [R45.851]   . Paresthesia [R20.2] 09/01/2014  . Chronic daily headache [R51] 09/01/2014  . Acute cholecystitis [K81.0] 05/12/2012   Total Time spent with patient: 25 minutes    Past Medical History:  Past Medical History  Diagnosis Date  . Hep C w/ coma, chronic   . Bipolar 1 disorder   . Substance abuse   . Cholecystitis 05/12/2012  . Paresthesia 09/01/2014  . Obese   . Hyperlipemia   . Vitamin D deficiency   . Substance abuse     cocaine  . Chronic daily headache 09/01/2014    Past Surgical History  Procedure Laterality Date  . Appendectomy    . Eye surgery      strabismus surgery  . Cholecystectomy   05/12/2012    Procedure: LAPAROSCOPIC CHOLECYSTECTOMY WITH INTRAOPERATIVE CHOLANGIOGRAM;  Surgeon: Ralene Ok, MD;  Location: [place];  Service: General;  Laterality: N/A;   Family History:  Family History  Problem Relation Age of Onset  . Heart disease Mother   . Diabetes Mother   . Diabetes Father   . Heart disease Father   . Diabetes Sister   . Diabetes Brother    Social History:  History  Alcohol Use No    Comment: Patient denies      History  Drug Use  . Yes  . Special: "Crack" cocaine    Comment: daily cocaine use. as much as I can get.     History   Social History  . Marital Status: Legally Separated    Spouse Name: N/A  . Number of Children: N/A  . Years of Education: N/A   Social History Main Topics  . Smoking status: Current Every Day Smoker -- 0.50 packs/day for 25 years    Types: Cigarettes  . Smokeless tobacco: Never Used  . Alcohol Use: No     Comment: Patient denies   . Drug Use: Yes    Special: "Crack" cocaine     Comment: daily cocaine use. as much as I can get.   Marland Kitchen Sexual Activity: Yes    Birth Control/ Protection: Condom   Other Topics Concern  . None   Social History Narrative   Additional History:    Sleep: improved  Appetite:  Improved    Assessment:   Musculoskeletal: Strength & Muscle Tone: within normal limits Gait & Station:  States gait is made difficult by pain Patient leans: N/A   Psychiatric Specialty Exam: Physical Exam  ROS describes chronic pain, worse on lower extremities   Blood pressure 107/51, pulse 87, temperature 98.1 F (36.7 C), temperature source Oral, resp. rate 16, height 5' (1.524 m), weight 208 lb (94.348 kg), last menstrual period 12/05/2014, SpO2 100 %.Body mass index is 40.62 kg/(m^2).  General Appearance:  Improved  Grooming   Eye Contact::  Good  Speech:  Normal Rate  Volume:  Normal  Mood:  Reports she is feeling better  Affect:   More reactive , brighter   Thought Process:  Linear   Orientation:  Other:  fully alert and attentive   Thought Content:  Rumination and denies hallucinations, no delusions, not internally preoccupied   Suicidal Thoughts:  No- at this time denies any self injurious ideations, no SI.  Homicidal Thoughts:  No  Memory:  recent and remote grossly intact   Judgement:  Fair  Insight:  Fair  Psychomotor Activity:  Normal  Concentration:  Good  Recall:  Good  Fund of Knowledge:Good  Language: Good  Akathisia:  Negative  Handed:  Right  AIMS (if indicated):     Assets:  Desire for Improvement Resilience  ADL's:  Impaired  Cognition: WNL  Sleep:        Current Medications: Current Facility-Administered Medications  Medication Dose Route Frequency Provider Last Rate Last Dose  . acetaminophen (TYLENOL) tablet 650 mg  650 mg Oral Q6H PRN Delfin Gant, NP      . alum & mag hydroxide-simeth (MAALOX/MYLANTA) 200-200-20 MG/5ML suspension 30 mL  30 mL Oral Q4H PRN Delfin Gant, NP      . cholecalciferol (VITAMIN D) tablet 1,000 Units  1,000 Units Oral Daily Mojeed Akintayo   1,000 Units at 02/08/15 0755  . hydrOXYzine (ATARAX/VISTARIL) tablet 25 mg  25 mg Oral Q4H PRN Lurena Nida, NP   25 mg at 02/07/15 0839  . ibuprofen (ADVIL,MOTRIN) tablet 800 mg  800 mg Oral Q6H PRN Mojeed Akintayo   800 mg at 02/07/15 0839  . lamoTRIgine (LAMICTAL) tablet 25 mg  25 mg Oral Daily Mojeed Akintayo   25 mg at 02/08/15 0755  . lurasidone (LATUDA) tablet 60 mg  60 mg Oral QHS Mojeed Akintayo   60 mg at 02/07/15 2138  . magnesium hydroxide (MILK OF MAGNESIA) suspension 30 mL  30 mL Oral Daily PRN Delfin Gant, NP   30 mL at 02/05/15 1414  . nicotine (NICODERM CQ - dosed in mg/24 hours) patch 21 mg  21 mg Transdermal Daily Jenne Campus, MD   21 mg at 02/08/15 0754  . pregabalin (LYRICA) capsule 75 mg  75 mg Oral BID Jenne Campus, MD   75 mg at 02/08/15 0755  . rosuvastatin (CRESTOR) tablet 10 mg  10 mg Oral QHS Mojeed Akintayo   10 mg at  02/07/15 2138  . traZODone (DESYREL) tablet 25 mg  25 mg Oral QHS PRN Jenne Campus, MD   25 mg at 02/07/15 2200    Lab Results:  Results for orders placed or performed during the hospital encounter of 02/04/15 (from the past 48 hour(s))  Urinalysis with microscopic     Status: Abnormal   Collection Time: 02/06/15  6:28 PM  Result Value Ref Range   Color, Urine YELLOW YELLOW  APPearance CLOUDY (A) CLEAR   Specific Gravity, Urine 1.027 1.005 - 1.030   pH 6.0 5.0 - 8.0   Glucose, UA 100 (A) NEGATIVE mg/dL   Hgb urine dipstick SMALL (A) NEGATIVE   Bilirubin Urine NEGATIVE NEGATIVE   Ketones, ur NEGATIVE NEGATIVE mg/dL   Protein, ur NEGATIVE NEGATIVE mg/dL   Urobilinogen, UA 0.2 0.0 - 1.0 mg/dL   Nitrite NEGATIVE NEGATIVE   Leukocytes, UA SMALL (A) NEGATIVE   WBC, UA 7-10 <3 WBC/hpf   Squamous Epithelial / LPF MANY (A) RARE   Urine-Other TRICHOMONAS PRESENT     Comment: Performed at [facility]  GC/Chlamydia probe amp ([place])not at [facility]     Status: None   Collection Time: 02/07/15 12:00 AM  Result Value Ref Range   Chlamydia Negative     Comment: Normal Reference Range - Negative   Neisseria gonorrhea Negative     Comment: Normal Reference Range - Negative    Physical Findings: AIMS: Facial and Oral Movements Muscles of Facial Expression: None, normal Lips and Perioral Area: None, normal Jaw: None, normal Tongue: None, normal,Extremity Movements Upper (arms, wrists, hands, fingers): None, normal Lower (legs, knees, ankles, toes): None, normal, Trunk Movements Neck, shoulders, hips: None, normal, Overall Severity Severity of abnormal movements (highest score from questions above): None, normal Incapacitation due to abnormal movements: None, normal Patient's awareness of abnormal movements (rate only patient's report): No Awareness, Dental Status Current problems with teeth and/or dentures?: No Does patient usually wear dentures?: No  CIWA:   CIWA-Ar Total: 1 COWS:  COWS Total Score: 1   Assessment-  Mood improved- states she is feeling better and in particular more optimistic and motivated in sobriety at this time. Denies any SI. Tolerating medications well at this time.  She is starting to focus more on disposition options and is hoping for discharge soon. Pain reported as partially improved and at this time does not appear uncomfortable or in pain.  Treatment Plan Summary: Daily contact with patient to assess and evaluate symptoms and progress in treatment, Medication management, Plan inpatient admission and medicaitons as below  Continue  Lyrica   75  mgrs BID to address chronic pain Continue Latuda  60 mgrs QHS as mood stabilizer for mood disorder Continue Lamictal 25 mgrs QDAY for depression Hydroxyzine PRNs for Anxiety as needed  Continue Trazdone 25 mgrs QHS PRN for insomnia . Metronidazole for Trichomoniasis Labs pending- had also ordered HgbA1C , but not done this AM as patient had eaten before . Will be reordered for tomorrow- rationale for test is to follow up on (+) glucose  In urine .    Medical Decision Making:  Established Problem, Stable/Improving (1), Review of Psycho-Social Stressors (1), Review or order clinical lab tests (1) and Review of Medication Regimen & Side Effects (2)     Patricia Hall 02/08/2015, 3:23 PM

## 2015-02-08 NOTE — Progress Notes (Signed)
Recreation Therapy Notes  Date: 07.27.16 Time: 9:30 am Location: [address] Group Room  Group Topic: Stress Management  Goal Area(s) Addresses:  Patient will verbalize importance of using healthy stress management.  Patient will identify positive emotions associated with healthy stress management.   Behavioral Response: Engaged  Intervention: Stress Management  Activity :  Guided Automotive engineer.  LRT introduced the technique of guided imagery.  A script was used to deliver the technique to the patients.  Patients were asked to follow the script read a loud by LRT to engage in the technique of guided imagery.  Education:  Stress Management, Discharge Planning.   Education Outcome: Acknowledges edcuation/In group clarification offered/Needs additional education  Clinical Observations/Feedback: Patient attended group.  Victorino Sparrow, LRT/CTRS         Victorino Sparrow A 02/08/2015 12:33 PM

## 2015-02-08 NOTE — Progress Notes (Signed)
Adult Psychoeducational Group Note  Date:  02/08/2015 Time:  9:59 PM  Group Topic/Focus:  Wrap-Up Group:   The focus of this group is to help patients review their daily goal of treatment and discuss progress on daily workbooks.  Participation Level:  Active  Participation Quality:  Appropriate  Affect:  Appropriate  Cognitive:  Appropriate  Insight: Appropriate  Engagement in Group:  Engaged  Modes of Intervention:  Discussion  Additional Comments: The patient expressed that in group she learned to replace positive things for negative ones.The patient also said that she had a good day.  Nash Shearer 02/08/2015, 9:59 PM

## 2015-02-08 NOTE — BHH Group Notes (Signed)
[facility] LCSW Aftercare Discharge Planning Group Note  02/08/2015 8:45 AM  Participation Quality: Alert, Appropriate and Oriented  Mood/Affect: Appropriate  Depression Rating: 8  Anxiety Rating: 8  Thoughts of Suicide: Pt states "I'm undecided. If I find out I have diabetes I don't know what I'll do."  Will you contract for safety? "Unsure"  Current AVH: Pt denies  Plan for Discharge/Comments: Pt attended discharge planning group and actively participated in group. CSW discussed suicide prevention education with the group and encouraged them to discuss discharge planning and any relevant barriers. Pt presented as anxious and reports being concerned about lab work that was drawn yesterday. She expressed that she was experiencing passive SI. She is interested in following-up with [facility] as well as an IOP program.  Transportation Means: Pt reports access to transportation  Supports: No supports mentioned at this time  Peri Maris, [place] 02/08/2015 11:08 AM

## 2015-02-09 ENCOUNTER — Encounter (HOSPITAL_COMMUNITY): Payer: Self-pay | Admitting: *Deleted

## 2015-02-09 LAB — LIPID PANEL
CHOL/HDL RATIO: 2.6 ratio
Cholesterol: 161 mg/dL (ref 0–200)
HDL: 61 mg/dL (ref >40)
LDL Cholesterol: 85 mg/dL (ref 0–99)
TRIGLYCERIDES: 75 mg/dL (ref <150)
VLDL: 15 mg/dL (ref 0–40)

## 2015-02-09 MED ORDER — ROSUVASTATIN CALCIUM 10 MG PO TABS: 10.0000 mg | ORAL_TABLET | Freq: Every day | ORAL | Status: DC

## 2015-02-09 MED ORDER — TRAZODONE HCL 50 MG PO TABS: 25.0000 mg | ORAL_TABLET | Freq: Every evening | ORAL | Status: DC | PRN

## 2015-02-09 MED ORDER — LURASIDONE HCL 60 MG PO TABS: 60.0000 mg | ORAL_TABLET | Freq: Every day | ORAL | Status: DC

## 2015-02-09 MED ORDER — LAMOTRIGINE 25 MG PO TABS: 25.0000 mg | ORAL_TABLET | Freq: Every day | ORAL | Status: DC

## 2015-02-09 MED ORDER — NICOTINE 21 MG/24HR TD PT24: 21.0000 mg | MEDICATED_PATCH | Freq: Every day | TRANSDERMAL | Status: DC

## 2015-02-09 MED ORDER — PREGABALIN 75 MG PO CAPS: 75.0000 mg | ORAL_CAPSULE | Freq: Two times a day (BID) | ORAL | Status: DC

## 2015-02-09 MED ORDER — CHOLECALCIFEROL 25 MCG (1000 UT) PO TABS: 1000.0000 [IU] | ORAL_TABLET | Freq: Every day | ORAL | Status: DC

## 2015-02-09 NOTE — Progress Notes (Signed)
  [facility] Adult Case Management Discharge Plan :  Will you be returning to the same living situation after discharge:  Yes,  Pt is returning to her apartment At discharge, do you have transportation home?: Yes,  Pt's friend to pick her up Do you have the ability to pay for your medications: Yes,  Pt provided with samples and prescriptions  Release of information consent forms completed and in the chart;  Patient's signature needed at discharge.  Patient to Follow up at: Follow-up Information    Follow up with [facility] On 02/13/2015.   Specialty:  Family Medicine   Why:  at 10:00am for medication management with Wells Guiles.   Contact information:   [address] [place] [address] [phone]       Follow up with [facility].   Specialty:  Family Medicine   Why:  Marlowe Kays, your therapist will contact you with your next appointment time.   Contact information:   [address] [place] [address] [phone]       [facility] with [facility].   Specialty:  Behavioral Health   Why:  You may walk-in between 9am-12pm on Tuesday to have your initial assessment done for the Intensive Outpatient Program.   Contact information:   [address] [place] [address] [phone]       Patient denies SI/HI: Yes,  Pt denies    Safety Planning and Suicide Prevention discussed: Yes,  with friend; see SPE note for further details  Have you used any form of tobacco in the last 30 days? (Cigarettes, Smokeless Tobacco, Cigars, and/or Pipes): Yes  Has patient been referred to the Quitline?: Patient refused referral  Bo Mcclintock 02/09/2015, 12:18 PM
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
## 2015-02-09 NOTE — BHH Group Notes (Signed)
[facility] Group Notes:  (Nursing/MHT/Case Management/Adjunct)  Date:  02/09/2015  Time: 0900 am  Type of Therapy:  Psychoeducational Skills  Participation Level:  Active  Participation Quality:  Appropriate and Attentive  Affect:  Appropriate  Cognitive:  Alert and Appropriate  Insight:  Good  Engagement in Group:  Supportive  Modes of Intervention:  Support  Summary of Progress/Problems: Patient is a possible discharge today.  Her neighbor will be picking her up.  She would like some ensures to take home.  Informed her nurse to relay info to doctor.  Zipporah Plants 02/09/2015, 10:02 AM

## 2015-02-09 NOTE — Discharge Summary (Signed)
Physician Discharge Summary Note  Patient:  Patricia Hall is an 49 y.o., female MRN:  [id number] DOB:  08-16-1965 Patient phone:  [phone] (home)  Patient address:   [address] [place] [address],  Total Time spent with patient: 30 minutes  Date of Admission:  02/04/2015 Date of Discharge: 02/09/2015  Reason for Admission:  depression  Principal Problem: Bipolar disorder, current episode depressed, severe, without psychotic features Discharge Diagnoses: Patient Active Problem List   Diagnosis Date Noted  . Bipolar disorder, current episode depressed, severe, without psychotic features [F31.4] 02/04/2015  . Bipolar affective disorder, depressed [F31.30] 02/04/2015  . Depression [F32.9]   . Suicide ideation [R45.851]   . Paresthesia [R20.2] 09/01/2014  . Chronic daily headache [R51] 09/01/2014  . Acute cholecystitis [K81.0] 05/12/2012    Musculoskeletal: Strength & Muscle Tone: within normal limits Gait & Station: normal Patient leans: N/A  Psychiatric Specialty Exam: Physical Exam  Vitals reviewed. Psychiatric: Her mood appears anxious. She does not exhibit a depressed mood.    Review of Systems  All other systems reviewed and are negative.   Blood pressure 109/56, pulse 71, temperature 97.8 F (36.6 C), temperature source Oral, resp. rate 20, height 5' (1.524 m), weight 94.348 kg (208 lb), last menstrual period 12/05/2014, SpO2 100 %.Body mass index is 40.62 kg/(m^2).   General Appearance: Fairly Groomed  Engineer, water:: Fair  Speech: Clear and [id number]  Volume: Normal  Mood: Euthymic  Affect: Appropriate  Thought Process: Coherent and Goal Directed  Orientation: Full (Time, Place, and Person)  Thought Content: plans as she moves on, relapse prevention plan  Suicidal Thoughts: No  Homicidal Thoughts: No  Memory: Immediate; Fair Recent; Fair Remote; Fair  Judgement: Fair  Insight: Present  Psychomotor  Activity: Normal  Concentration: Fair  Recall: [employer] [place]  Language: Fair  Akathisia: No  Handed: Left  AIMS (if indicated):    Assets: Desire for Improvement Housing Social Support  Sleep: Number of Hours: 5.25  Cognition: WNL  ADL's: Intact       Have you used any form of tobacco in the last 30 days? (Cigarettes, Smokeless Tobacco, Cigars, and/or Pipes): Yes  Has this patient used any form of tobacco in the last 30 days? (Cigarettes, Smokeless Tobacco, Cigars, and/or Pipes) Yes, A prescription for an FDA-approved tobacco cessation medication was offered at discharge and the patient refused samples given and Rx given  Past Medical History:  Past Medical History  Diagnosis Date  . Hep C w/ coma, chronic   . Bipolar 1 disorder   . Substance abuse   . Cholecystitis 05/12/2012  . Paresthesia 09/01/2014  . Obese   . Hyperlipemia   . Vitamin D deficiency   . Substance abuse     cocaine  . Chronic daily headache 09/01/2014    Past Surgical History  Procedure Laterality Date  . Appendectomy    . Eye surgery      strabismus surgery  . Cholecystectomy  05/12/2012    Procedure: LAPAROSCOPIC CHOLECYSTECTOMY WITH INTRAOPERATIVE CHOLANGIOGRAM;  Surgeon: Ralene Ok, MD;  Location: [place];  Service: General;  Laterality: N/A;   Family History:  Family History  Problem Relation Age of Onset  . Heart disease Mother   . Diabetes Mother   . Diabetes Father   . Heart disease Father   . Diabetes Sister   . Diabetes Brother    Social History:  History  Alcohol Use No    Comment: Patient denies  History  Drug Use  . Yes  . Special: "Crack" cocaine    Comment: daily cocaine use. as much as I can get.     History   Social History  . Marital Status: Legally Separated    Spouse Name: N/A  . Number of Children: N/A  . Years of Education: N/A   Social History Main Topics  . Smoking status: Current Every Day Smoker -- 0.50  packs/day for 25 years    Types: Cigarettes  . Smokeless tobacco: Never Used  . Alcohol Use: No     Comment: Patient denies   . Drug Use: Yes    Special: "Crack" cocaine     Comment: daily cocaine use. as much as I can get.   Marland Kitchen Sexual Activity: Yes    Birth Control/ Protection: Condom   Other Topics Concern  . None   Social History Narrative   Risk to Self: Is patient at risk for suicide?: Yes What has been your use of drugs/alcohol within the last 12 months?: Two month relapse on crack cocaine which patient smokes daily; reports she will smoke "as much as I can afford, usually over a gram" Risk to Others:   Prior Inpatient Therapy:   Prior Outpatient Therapy:    Level of Care:  OP  Hospital Course:  Patricia Hall was admitted for Bipolar disorder, current episode depressed, severe, without psychotic features and crisis management.She was treated discharged with the medications listed below under Medication List.  Medical problems were identified and treated as needed.  Home medications were restarted as appropriate.  Improvement was monitored by observation and Patricia Hall daily report of symptom reduction.  Emotional and mental status was monitored by daily self-inventory reports completed by Patricia Hall and clinical staff.         Patricia Hall was evaluated by the treatment team for stability and plans for continued recovery upon discharge.  Patricia Hall motivation was an integral factor for scheduling further treatment.  Employment, transportation, bed availability, health status, family support, and any pending legal issues were also considered during her hospital stay.  She was offered further treatment options upon discharge including but not limited to Residential, Intensive Outpatient, and Outpatient treatment.  Patricia Hall will follow up with the services as listed below under Follow Up Information.     Upon completion of this admission the patient was both  mentally and medically stable for discharge denying suicidal/homicidal ideation, auditory/visual/tactile hallucinations, delusional thoughts and paranoia.      Consults:  psychiatry  Significant Diagnostic Studies:  labs: per ED  Discharge Vitals:   Blood pressure 109/56, pulse 71, temperature 97.8 F (36.6 C), temperature source Oral, resp. rate 20, height 5' (1.524 m), weight 94.348 kg (208 lb), last menstrual period 12/05/2014, SpO2 100 %. Body mass index is 40.62 kg/(m^2). Lab Results:   Results for orders placed or performed during the hospital encounter of 02/04/15 (from the past 72 hour(s))  Urinalysis with microscopic     Status: Abnormal   Collection Time: 02/06/15  6:28 PM  Result Value Ref Range   Color, Urine YELLOW YELLOW   APPearance CLOUDY (A) CLEAR   Specific Gravity, Urine 1.027 1.005 - 1.030   pH 6.0 5.0 - 8.0   Glucose, UA 100 (A) NEGATIVE mg/dL   Hgb urine dipstick SMALL (A) NEGATIVE   Bilirubin Urine NEGATIVE NEGATIVE   Ketones, ur NEGATIVE NEGATIVE mg/dL   Protein, ur NEGATIVE NEGATIVE mg/dL   Urobilinogen, UA 0.2 0.0 -  1.0 mg/dL   Nitrite NEGATIVE NEGATIVE   Leukocytes, UA SMALL (A) NEGATIVE   WBC, UA 7-10 <3 WBC/hpf   Squamous Epithelial / LPF MANY (A) RARE   Urine-Other TRICHOMONAS PRESENT     Comment: Performed at [facility]  GC/Chlamydia probe amp ([place])not at [facility]     Status: None   Collection Time: 02/07/15 12:00 AM  Result Value Ref Range   Chlamydia Negative     Comment: Normal Reference Range - Negative   Neisseria gonorrhea Negative     Comment: Normal Reference Range - Negative  Lipid panel     Status: None   Collection Time: 02/09/15  6:29 AM  Result Value Ref Range   Cholesterol 161 0 - 200 mg/dL   Triglycerides 75 <150 mg/dL   HDL 61 >40 mg/dL   Total CHOL/HDL Ratio 2.6 RATIO   VLDL 15 0 - 40 mg/dL   LDL Cholesterol 85 0 - 99 mg/dL    Comment:        Total Cholesterol/HDL:CHD Risk Coronary Heart Disease  Risk Table                     Men   Women  1/2 Average Risk   3.4   3.3  Average Risk       5.0   4.4  2 X Average Risk   9.6   7.1  3 X Average Risk  23.4   11.0        Use the calculated Patient Ratio above and the CHD Risk Table to determine the patient's CHD Risk.        ATP III CLASSIFICATION (LDL):  <100     mg/dL   Optimal  100-129  mg/dL   Near or Above                    Optimal  130-159  mg/dL   Borderline  160-189  mg/dL   High  >190     mg/dL   Very High Performed at [facility]     Physical Findings: AIMS: Facial and Oral Movements Muscles of Facial Expression: None, normal Lips and Perioral Area: None, normal Jaw: None, normal Tongue: None, normal,Extremity Movements Upper (arms, wrists, hands, fingers): Minimal Lower (legs, knees, ankles, toes): Minimal, Trunk Movements Neck, shoulders, hips: Minimal, Overall Severity Severity of abnormal movements (highest score from questions above): Minimal Incapacitation due to abnormal movements: Minimal Patient's awareness of abnormal movements (rate only patient's report): No Awareness, Dental Status Current problems with teeth and/or dentures?: No Does patient usually wear dentures?: No  CIWA:  CIWA-Ar Total: 1 COWS:  COWS Total Score: 1   See Psychiatric Specialty Exam and Suicide Risk Assessment completed by Attending Physician prior to discharge.  Discharge destination:  Home  Is patient on multiple antipsychotic therapies at discharge:  No   Has Patient had three or more failed trials of antipsychotic monotherapy by history:  No    Recommended Plan for Multiple Antipsychotic Therapies: NA     Medication List    STOP taking these medications        [employer] D3 1000 UNITS capsule  Generic drug:  Cholecalciferol  Replaced by:  Cholecalciferol 1000 UNITS tablet     hydrOXYzine 50 MG tablet  Commonly known as:  ATARAX/VISTARIL     ibuprofen 200 MG tablet  Commonly known as:  ADVIL,MOTRIN      phentermine 37.5 MG tablet  Commonly  known as:  ADIPEX-P      TAKE these medications      Indication   Cholecalciferol 1000 UNITS tablet  Take 1 tablet (1,000 Units total) by mouth daily.   Indication:  Vitamin D Deficiency     lamoTRIgine 25 MG tablet  Commonly known as:  LAMICTAL  Take 1 tablet (25 mg total) by mouth daily.   Indication:  Manic-Depression     Lurasidone HCl 60 MG Tabs  Take 60 mg by mouth at bedtime.   Indication:  Depressive Phase of Manic-Depression     nicotine 21 mg/24hr patch  Commonly known as:  NICODERM CQ - dosed in mg/24 hours  Place 1 patch (21 mg total) onto the skin daily.   Indication:  Nicotine Addiction     pregabalin 75 MG capsule  Commonly known as:  LYRICA  Take 1 capsule (75 mg total) by mouth 2 (two) times daily.   Indication:  Fibromyalgia Syndrome     rosuvastatin 10 MG tablet  Commonly known as:  CRESTOR  Take 1 tablet (10 mg total) by mouth at bedtime.   Indication:  High Amount of Fats in the Blood     traZODone 50 MG tablet  Commonly known as:  DESYREL  Take 0.5 tablets (25 mg total) by mouth at bedtime as needed for sleep.   Indication:  Trouble Sleeping, Major Depressive Disorder           Follow-up Information    Follow up with [facility] On 02/13/2015.   Specialty:  Family Medicine   Why:  at 10:00am for medication management with Wells Guiles.   Contact information:   [address] [place] [address] [phone]       Follow up with [facility].   Specialty:  Family Medicine   Why:  Marlowe Kays, your therapist will contact you with your next appointment time.   Contact information:   [address] [place] [address] [phone]       Follow [facility] with [facility].   Specialty:  Behavioral Health   Why:  You may walk-in between 9am-12pm on Tuesday to have your initial assessment done for the Intensive Outpatient Program.    Contact information:   [address] [place] [address] [phone]       Follow-up recommendations:  Activity:  as tol, diet as tol  Comments:  1.  Take all your medications as prescribed.              2.  Report any adverse side effects to outpatient provider.                       3.  Patient instructed to not use alcohol or illegal drugs while on prescription medicines.            4.  In the event of worsening symptoms, instructed patient to call 911, the crisis hotline or go to nearest emergency room for evaluation of symptoms.  Total Discharge Time:  30 min  Signed: Freda Munro May Agustin AGNP-BC 02/09/2015, 5:55 PM  I personally assessed the patient and formulated the plan Geralyn Flash A. Sabra Heck, M.D.

## 2015-02-09 NOTE — Progress Notes (Signed)
MD completed DC SRA and DC order. Pt stated she is ready for dc. She completes her daily assessment and on it she wrote she denied SI today and she rated her depression, hopelessness and anxiety " 0/  /0, respectively. She is asked what is one important thing she has l;earned since shes been in the hospital and she says " I'm killing myself.Marland KitchenMarland KitchenMarland KitchenIve got to stop doing drugs". She is given her supply medications as well as the prescriptions and then she is escorted to bldg entrance and dc'd.

## 2015-02-09 NOTE — [facility] Group Notes (Signed)
[facility] Group Therapy 02/09/2015 1:15pm  Type of Therapy: Mental Health Association Presentation  Participation Level: Active  Participation Quality: Attentive  Affect: Appropriate  Cognitive: Oriented  Insight: Developing/Improving  Engagement in Therapy: Engaged  Modes of Intervention: Discussion, Education and Socialization  Summary of Progress/Problems: Mental Health Association ([place]) Speaker came to talk about his personal journey with substance abuse and addiction. The pt processed ways by which to relate to the speaker. [place] speaker provided handouts and educational information pertaining to groups and services offered by the [facility]. Pt was engaged in speaker's presentation and was receptive to resources provided.    Peri Maris, LCSWA 02/09/2015 1:41 PM

## 2015-02-09 NOTE — Progress Notes (Signed)
Pt has been to the NS reporting anxiety and worrying about her cats at home.  She has a friend taking care of them for her.  She denies SI/HI/AVH this evening.  She has been in the dayroom talking with peers.  She says she has been going to groups.  She tells this Probation officer that she may be going home on Thursday.  Pt received Trazodone for sleep at bedtime.  She appears very animated in Scientific laboratory technician.  She makes her needs and concerns known to staff.  She says her medications are working for her.  Support and encouragement offered.  Safety maintained with q15 minute checks.

## 2015-02-10 ENCOUNTER — Ambulatory Visit: Payer: Medicare Other | Admitting: Neurology

## 2015-02-10 LAB — HEMOGLOBIN A1C
HEMOGLOBIN A1C: 6.2 % — AB (ref 4.8–5.6)
MEAN PLASMA GLUCOSE: 131 mg/dL

## 2015-02-10 LAB — HIV ANTIBODY (ROUTINE TESTING W REFLEX): HIV Screen 4th Generation wRfx: NONREACTIVE

## 2015-02-10 LAB — RPR: RPR Ser Ql: NONREACTIVE

## 2015-03-21 DIAGNOSIS — F3111 Bipolar disorder, current episode manic without psychotic features, mild: Secondary | ICD-10-CM | POA: Diagnosis not present

## 2015-03-27 DIAGNOSIS — F3111 Bipolar disorder, current episode manic without psychotic features, mild: Secondary | ICD-10-CM | POA: Diagnosis not present

## 2015-04-03 DIAGNOSIS — F3111 Bipolar disorder, current episode manic without psychotic features, mild: Secondary | ICD-10-CM | POA: Diagnosis not present

## 2015-04-10 DIAGNOSIS — F3111 Bipolar disorder, current episode manic without psychotic features, mild: Secondary | ICD-10-CM | POA: Diagnosis not present

## 2015-04-17 DIAGNOSIS — F3111 Bipolar disorder, current episode manic without psychotic features, mild: Secondary | ICD-10-CM | POA: Diagnosis not present

## 2015-04-20 ENCOUNTER — Ambulatory Visit: Payer: Medicare Other | Attending: Internal Medicine | Admitting: Physical Therapy

## 2015-05-01 DIAGNOSIS — F3111 Bipolar disorder, current episode manic without psychotic features, mild: Secondary | ICD-10-CM | POA: Diagnosis not present

## 2015-05-15 DIAGNOSIS — F3111 Bipolar disorder, current episode manic without psychotic features, mild: Secondary | ICD-10-CM | POA: Diagnosis not present

## 2015-06-12 DIAGNOSIS — F3111 Bipolar disorder, current episode manic without psychotic features, mild: Secondary | ICD-10-CM | POA: Diagnosis not present

## 2015-12-04 DIAGNOSIS — R7303 Prediabetes: Secondary | ICD-10-CM | POA: Diagnosis not present

## 2015-12-04 DIAGNOSIS — Z136 Encounter for screening for cardiovascular disorders: Secondary | ICD-10-CM | POA: Diagnosis not present

## 2015-12-04 DIAGNOSIS — Z01118 Encounter for examination of ears and hearing with other abnormal findings: Secondary | ICD-10-CM | POA: Diagnosis not present

## 2015-12-04 DIAGNOSIS — Z113 Encounter for screening for infections with a predominantly sexual mode of transmission: Secondary | ICD-10-CM | POA: Diagnosis not present

## 2015-12-04 DIAGNOSIS — E559 Vitamin D deficiency, unspecified: Secondary | ICD-10-CM | POA: Diagnosis not present

## 2015-12-04 DIAGNOSIS — R635 Abnormal weight gain: Secondary | ICD-10-CM | POA: Diagnosis not present

## 2015-12-04 DIAGNOSIS — H538 Other visual disturbances: Secondary | ICD-10-CM | POA: Diagnosis not present

## 2015-12-04 DIAGNOSIS — G629 Polyneuropathy, unspecified: Secondary | ICD-10-CM | POA: Diagnosis not present

## 2015-12-04 DIAGNOSIS — F419 Anxiety disorder, unspecified: Secondary | ICD-10-CM | POA: Diagnosis not present

## 2015-12-04 DIAGNOSIS — E785 Hyperlipidemia, unspecified: Secondary | ICD-10-CM | POA: Diagnosis not present

## 2015-12-04 DIAGNOSIS — Z131 Encounter for screening for diabetes mellitus: Secondary | ICD-10-CM | POA: Diagnosis not present

## 2015-12-04 DIAGNOSIS — Z5181 Encounter for therapeutic drug level monitoring: Secondary | ICD-10-CM | POA: Diagnosis not present

## 2016-09-11 DIAGNOSIS — F419 Anxiety disorder, unspecified: Secondary | ICD-10-CM | POA: Diagnosis not present

## 2016-09-11 DIAGNOSIS — B359 Dermatophytosis, unspecified: Secondary | ICD-10-CM | POA: Diagnosis not present

## 2016-09-11 DIAGNOSIS — G629 Polyneuropathy, unspecified: Secondary | ICD-10-CM | POA: Diagnosis not present

## 2016-09-11 DIAGNOSIS — E785 Hyperlipidemia, unspecified: Secondary | ICD-10-CM | POA: Diagnosis not present

## 2016-09-11 DIAGNOSIS — R5383 Other fatigue: Secondary | ICD-10-CM | POA: Diagnosis not present

## 2016-09-11 DIAGNOSIS — E559 Vitamin D deficiency, unspecified: Secondary | ICD-10-CM | POA: Diagnosis not present

## 2016-09-11 DIAGNOSIS — Z5181 Encounter for therapeutic drug level monitoring: Secondary | ICD-10-CM | POA: Diagnosis not present

## 2016-09-11 DIAGNOSIS — Z136 Encounter for screening for cardiovascular disorders: Secondary | ICD-10-CM | POA: Diagnosis not present

## 2016-09-11 DIAGNOSIS — F3130 Bipolar disorder, current episode depressed, mild or moderate severity, unspecified: Secondary | ICD-10-CM | POA: Diagnosis not present

## 2016-09-11 DIAGNOSIS — H9209 Otalgia, unspecified ear: Secondary | ICD-10-CM | POA: Diagnosis not present

## 2016-09-11 DIAGNOSIS — Z0110 Encounter for examination of ears and hearing without abnormal findings: Secondary | ICD-10-CM | POA: Diagnosis not present

## 2016-09-11 DIAGNOSIS — H539 Unspecified visual disturbance: Secondary | ICD-10-CM | POA: Diagnosis not present

## 2016-09-11 DIAGNOSIS — R635 Abnormal weight gain: Secondary | ICD-10-CM | POA: Diagnosis not present

## 2017-01-01 ENCOUNTER — Encounter (HOSPITAL_COMMUNITY): Payer: Self-pay | Admitting: *Deleted

## 2017-01-01 ENCOUNTER — Emergency Department (HOSPITAL_COMMUNITY)
Admission: EM | Admit: 2017-01-01 | Discharge: 2017-01-02 | Disposition: A | Discharge status: Home / Self Care | Payer: Medicare Other | Attending: Emergency Medicine | Admitting: Emergency Medicine

## 2017-01-01 DIAGNOSIS — R45851 Suicidal ideations: Secondary | ICD-10-CM | POA: Insufficient documentation

## 2017-01-01 DIAGNOSIS — F17210 Nicotine dependence, cigarettes, uncomplicated: Secondary | ICD-10-CM | POA: Insufficient documentation

## 2017-01-01 DIAGNOSIS — F1410 Cocaine abuse, uncomplicated: Secondary | ICD-10-CM | POA: Diagnosis not present

## 2017-01-01 DIAGNOSIS — F329 Major depressive disorder, single episode, unspecified: Secondary | ICD-10-CM | POA: Diagnosis present

## 2017-01-01 NOTE — ED Triage Notes (Signed)
Patient is alert and oriented x4.  She is complaining of suicicdal thoughts.  Patient states that she has not been taking her medications and using crack cocaine and the thoughts of suicide have increased.  Currently she is complaining of fibromyalgia pain.

## 2017-01-01 NOTE — ED Notes (Signed)
Bed: [facility] Expected date:  Expected time:  Means of arrival:  Comments: 

## 2017-01-02 ENCOUNTER — Inpatient Hospital Stay (HOSPITAL_COMMUNITY)
Admission: EM | Admit: 2017-01-02 | Discharge: 2017-01-07 | DRG: 897 | Disposition: A | Discharge status: Home / Self Care | Payer: Medicare Other | Source: Intra-hospital | Attending: Psychiatry | Admitting: Psychiatry

## 2017-01-02 ENCOUNTER — Encounter (HOSPITAL_COMMUNITY): Payer: Self-pay

## 2017-01-02 DIAGNOSIS — F1994 Other psychoactive substance use, unspecified with psychoactive substance-induced mood disorder: Secondary | ICD-10-CM | POA: Diagnosis present

## 2017-01-02 DIAGNOSIS — Z79899 Other long term (current) drug therapy: Secondary | ICD-10-CM | POA: Diagnosis not present

## 2017-01-02 DIAGNOSIS — R45851 Suicidal ideations: Secondary | ICD-10-CM | POA: Diagnosis present

## 2017-01-02 DIAGNOSIS — F1490 Cocaine use, unspecified, uncomplicated: Secondary | ICD-10-CM

## 2017-01-02 DIAGNOSIS — E559 Vitamin D deficiency, unspecified: Secondary | ICD-10-CM | POA: Diagnosis present

## 2017-01-02 DIAGNOSIS — E669 Obesity, unspecified: Secondary | ICD-10-CM | POA: Diagnosis present

## 2017-01-02 DIAGNOSIS — Z590 Homelessness: Secondary | ICD-10-CM

## 2017-01-02 DIAGNOSIS — F332 Major depressive disorder, recurrent severe without psychotic features: Secondary | ICD-10-CM | POA: Diagnosis not present

## 2017-01-02 DIAGNOSIS — F1410 Cocaine abuse, uncomplicated: Secondary | ICD-10-CM | POA: Diagnosis not present

## 2017-01-02 DIAGNOSIS — F17210 Nicotine dependence, cigarettes, uncomplicated: Secondary | ICD-10-CM

## 2017-01-02 DIAGNOSIS — F411 Generalized anxiety disorder: Secondary | ICD-10-CM | POA: Diagnosis present

## 2017-01-02 DIAGNOSIS — F1290 Cannabis use, unspecified, uncomplicated: Secondary | ICD-10-CM

## 2017-01-02 DIAGNOSIS — Z23 Encounter for immunization: Secondary | ICD-10-CM

## 2017-01-02 LAB — RAPID URINE DRUG SCREEN, HOSP PERFORMED
Amphetamines: NOT DETECTED
Barbiturates: NOT DETECTED
Benzodiazepines: NOT DETECTED
Cocaine: POSITIVE — AB
Opiates: NOT DETECTED
Tetrahydrocannabinol: POSITIVE — AB

## 2017-01-02 LAB — URINALYSIS, ROUTINE W REFLEX MICROSCOPIC
Bacteria, UA: NONE SEEN
Bilirubin Urine: NEGATIVE
GLUCOSE, UA: NEGATIVE mg/dL
KETONES UR: NEGATIVE mg/dL
Leukocytes, UA: NEGATIVE
Nitrite: NEGATIVE
PH: 6 (ref 5.0–8.0)
Protein, ur: NEGATIVE mg/dL
SPECIFIC GRAVITY, URINE: 1.013 (ref 1.005–1.030)

## 2017-01-02 LAB — COMPREHENSIVE METABOLIC PANEL
ALT: 20 U/L (ref 14–54)
AST: 29 U/L (ref 15–41)
Albumin: 3.8 g/dL (ref 3.5–5.0)
Alkaline Phosphatase: 106 U/L (ref 38–126)
Anion gap: 8 (ref 5–15)
BILIRUBIN TOTAL: 0.4 mg/dL (ref 0.3–1.2)
BUN: 12 mg/dL (ref 6–20)
CALCIUM: 9.3 mg/dL (ref 8.9–10.3)
CO2: 23 mmol/L (ref 22–32)
CREATININE: 0.76 mg/dL (ref 0.44–1.00)
Chloride: 112 mmol/L — ABNORMAL HIGH (ref 101–111)
GFR calc Af Amer: >60 mL/min (ref >60)
Glucose, Bld: 95 mg/dL (ref 65–99)
POTASSIUM: 3.8 mmol/L (ref 3.5–5.1)
Sodium: 143 mmol/L (ref 135–145)
TOTAL PROTEIN: 7.6 g/dL (ref 6.5–8.1)

## 2017-01-02 LAB — LIPID PANEL
CHOL/HDL RATIO: 2.8 ratio
Cholesterol: 240 mg/dL — ABNORMAL HIGH (ref 0–200)
HDL: 86 mg/dL (ref >40)
LDL CALC: 131 mg/dL — AB (ref 0–99)
Triglycerides: 115 mg/dL (ref <150)
VLDL: 23 mg/dL (ref 0–40)

## 2017-01-02 LAB — CBC
HCT: 39 % (ref 36.0–46.0)
Hemoglobin: 13 g/dL (ref 12.0–15.0)
MCH: 29.6 pg (ref 26.0–34.0)
MCHC: 33.3 g/dL (ref 30.0–36.0)
MCV: 88.8 fL (ref 78.0–100.0)
Platelets: 248 10*3/uL (ref 150–400)
RBC: 4.39 MIL/uL (ref 3.87–5.11)
RDW: 13.6 % (ref 11.5–15.5)
WBC: 10.9 10*3/uL — ABNORMAL HIGH (ref 4.0–10.5)

## 2017-01-02 LAB — SALICYLATE LEVEL: Salicylate Lvl: <7 mg/dL (ref 2.8–30.0)

## 2017-01-02 LAB — ETHANOL

## 2017-01-02 LAB — TSH: TSH: 1.204 u[IU]/mL (ref 0.350–4.500)

## 2017-01-02 LAB — ACETAMINOPHEN LEVEL: Acetaminophen (Tylenol), Serum: <10 ug/mL — ABNORMAL LOW (ref 10–30)

## 2017-01-02 LAB — PREGNANCY, URINE: Preg Test, Ur: NEGATIVE

## 2017-01-02 MED ORDER — HYDROXYZINE HCL 25 MG PO TABS
25.0000 mg | ORAL_TABLET | Freq: Three times a day (TID) | ORAL | Status: DC | PRN
  Administered 2017-01-02 – 2017-01-06 (×6): 25 mg via ORAL
  Filled 2017-01-02 (×5): qty 1
  Filled 2017-01-02: qty 20
  Filled 2017-01-02: qty 1

## 2017-01-02 MED ORDER — ALUM & MAG HYDROXIDE-SIMETH 200-200-20 MG/5ML PO SUSP
30.0000 mL | ORAL | Status: DC | PRN
  Administered 2017-01-05 – 2017-01-06 (×2): 30 mL via ORAL
  Filled 2017-01-02 (×2): qty 30

## 2017-01-02 MED ORDER — PNEUMOCOCCAL VAC POLYVALENT 25 MCG/0.5ML IJ INJ
0.5000 mL | INJECTION | INTRAMUSCULAR | Status: AC
  Administered 2017-01-04: 0.5 mL via INTRAMUSCULAR

## 2017-01-02 MED ORDER — NICOTINE 21 MG/24HR TD PT24
21.0000 mg | MEDICATED_PATCH | Freq: Every day | TRANSDERMAL | Status: DC
  Administered 2017-01-02 – 2017-01-04 (×3): 21 mg via TRANSDERMAL
  Filled 2017-01-02 (×5): qty 1

## 2017-01-02 MED ORDER — MAGNESIUM HYDROXIDE 400 MG/5ML PO SUSP
30.0000 mL | Freq: Every day | ORAL | Status: DC | PRN
  Administered 2017-01-03 – 2017-01-06 (×2): 30 mL via ORAL
  Filled 2017-01-02 (×3): qty 30

## 2017-01-02 MED ORDER — TRAZODONE HCL 50 MG PO TABS
50.0000 mg | ORAL_TABLET | Freq: Every evening | ORAL | Status: DC | PRN
  Administered 2017-01-02 – 2017-01-06 (×10): 50 mg via ORAL
  Filled 2017-01-02 (×2): qty 1
  Filled 2017-01-02: qty 28
  Filled 2017-01-02 (×11): qty 1

## 2017-01-02 MED ORDER — IBUPROFEN 200 MG PO TABS: 600.0000 mg | ORAL_TABLET | Freq: Three times a day (TID) | ORAL | Status: DC | PRN

## 2017-01-02 MED ORDER — GABAPENTIN 300 MG PO CAPS
300.0000 mg | ORAL_CAPSULE | Freq: Three times a day (TID) | ORAL | Status: DC
  Administered 2017-01-02 – 2017-01-06 (×13): 300 mg via ORAL
  Filled 2017-01-02 (×3): qty 1
  Filled 2017-01-02: qty 42
  Filled 2017-01-02 (×5): qty 1
  Filled 2017-01-02 (×2): qty 42
  Filled 2017-01-02 (×7): qty 1

## 2017-01-02 MED ORDER — OLANZAPINE 5 MG PO TABS
5.0000 mg | ORAL_TABLET | Freq: Every day | ORAL | Status: DC
  Administered 2017-01-02 – 2017-01-04 (×3): 5 mg via ORAL
  Filled 2017-01-02 (×4): qty 1

## 2017-01-02 MED ORDER — NICOTINE 21 MG/24HR TD PT24: 21.0000 mg | MEDICATED_PATCH | Freq: Every day | TRANSDERMAL | Status: DC

## 2017-01-02 MED ORDER — ROSUVASTATIN CALCIUM 10 MG PO TABS
10.0000 mg | ORAL_TABLET | Freq: Every day | ORAL | Status: DC
  Administered 2017-01-03 – 2017-01-06 (×4): 10 mg via ORAL
  Filled 2017-01-02: qty 14
  Filled 2017-01-02: qty 1
  Filled 2017-01-02: qty 14
  Filled 2017-01-02 (×4): qty 1

## 2017-01-02 MED ORDER — LAMOTRIGINE 25 MG PO TABS
25.0000 mg | ORAL_TABLET | Freq: Every day | ORAL | Status: DC
  Administered 2017-01-02: 25 mg via ORAL
  Filled 2017-01-02 (×3): qty 1

## 2017-01-02 MED ORDER — ACETAMINOPHEN 325 MG PO TABS
650.0000 mg | ORAL_TABLET | Freq: Four times a day (QID) | ORAL | Status: DC | PRN
  Administered 2017-01-05 – 2017-01-06 (×2): 650 mg via ORAL
  Filled 2017-01-02 (×3): qty 2
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
## 2017-01-02 NOTE — ED Provider Notes (Signed)
[facility] DEPT Provider Note   CSN: [id number] Arrival date & time: 01/01/17  2231     History   Chief Complaint Chief Complaint  Patient presents with  . Suicidal    HPI Patricia Hall is a 51 y.o. female.  Patient presents with depression and SI, stating "any way that's painless". She reports being unable to stop doing crack cocaine. No HI and AVH. She reports history of suicidal attempt.    The history is provided by the patient. No language interpreter was used.    Past Medical History:  Diagnosis Date  . Bipolar 1 disorder ([place])   . Cholecystitis 05/12/2012  . Chronic daily headache 09/01/2014  . Hep C w/ coma, chronic ([place])   . Hyperlipemia   . Obese   . Paresthesia 09/01/2014  . Substance abuse   . Substance abuse    cocaine  . Vitamin D deficiency     Patient Active Problem List   Diagnosis Date Noted  . Bipolar disorder, current episode depressed, severe, without psychotic features ([place]) 02/04/2015  . Bipolar affective disorder, depressed ([place]) 02/04/2015  . Depression   . Suicide ideation   . Paresthesia 09/01/2014  . Chronic daily headache 09/01/2014  . Acute cholecystitis 05/12/2012    Past Surgical History:  Procedure Laterality Date  . APPENDECTOMY    . CHOLECYSTECTOMY  05/12/2012   Procedure: LAPAROSCOPIC CHOLECYSTECTOMY WITH INTRAOPERATIVE CHOLANGIOGRAM;  Surgeon: Ralene Ok, MD;  Location: [facility];  Service: General;  Laterality: N/A;  . EYE SURGERY     strabismus surgery    OB History    No data available       Home Medications    Prior to Admission medications   Medication Sig Start Date End Date Taking? Authorizing Provider  cholecalciferol 1000 UNITS tablet Take 1 tablet (1,000 Units total) by mouth daily. Patient not taking: Reported on 01/01/2017 02/09/15   Kerrie Buffalo, NP  lamoTRIgine (LAMICTAL) 25 MG tablet Take 1 tablet (25 mg total) by mouth daily. Patient not taking: Reported on 01/01/2017 02/09/15   Kerrie Buffalo, NP  lurasidone 60 MG TABS Take 60 mg by mouth at bedtime. Patient not taking: Reported on 01/01/2017 02/09/15   Kerrie Buffalo, NP  nicotine (NICODERM CQ - DOSED IN MG/24 HOURS) 21 mg/24hr patch Place 1 patch (21 mg total) onto the skin daily. Patient not taking: Reported on 01/01/2017 02/09/15   Kerrie Buffalo, NP  pregabalin (LYRICA) 75 MG capsule Take 1 capsule (75 mg total) by mouth 2 (two) times daily. Patient not taking: Reported on 01/01/2017 02/09/15   Kerrie Buffalo, NP  rosuvastatin (CRESTOR) 10 MG tablet Take 1 tablet (10 mg total) by mouth at bedtime. Patient not taking: Reported on 01/01/2017 02/09/15   Kerrie Buffalo, NP  traZODone (DESYREL) 50 MG tablet Take 0.5 tablets (25 mg total) by mouth at bedtime as needed for sleep. Patient not taking: Reported on 01/01/2017 02/09/15   Kerrie Buffalo, NP    Family History Family History  Problem Relation Age of Onset  . Heart disease Mother   . Diabetes Mother   . Diabetes Father   . Heart disease Father   . Diabetes Sister   . Diabetes Brother     Social History Social History  Substance Use Topics  . Smoking status: Current Every Day Smoker    Packs/day: 0.50    Years: 25.00    Types: Cigarettes  . Smokeless tobacco: Never Used  . Alcohol use No     Comment:  Patient denies      Allergies   Patient has no known allergies.   Review of Systems Review of Systems  Constitutional: Negative for chills and fever.  HENT: Negative.   Respiratory: Negative.   Cardiovascular: Negative.   Gastrointestinal: Negative.   Musculoskeletal: Positive for myalgias ("I hurt everywhere. I have fibromyalgia".).  Skin: Negative.   Neurological: Negative.   Psychiatric/Behavioral: Positive for dysphoric mood and suicidal ideas. Negative for hallucinations.     Physical Exam Updated Vital Signs BP 107/85 (BP Location: Left Arm)   Pulse 74   Temp 98.1 F (36.7 C) (Oral)   Resp 20   Ht 5' 4.5" (1.638 m)   Wt 68 kg (150  lb)   SpO2 99%   BMI 25.35 kg/m   Physical Exam  Constitutional: She appears well-developed and well-nourished.  HENT:  Head: Normocephalic.  Neck: Normal range of motion. Neck supple.  Cardiovascular: Normal rate and regular rhythm.   No murmur heard. Pulmonary/Chest: Effort normal and breath sounds normal.  Abdominal: Soft. Bowel sounds are normal. There is no tenderness. There is no rebound and no guarding.  Musculoskeletal: Normal range of motion.  Neurological: She is alert.  Skin: Skin is warm and dry. No rash noted.  Psychiatric: Her speech is normal. She is not actively hallucinating. She exhibits a depressed mood. She expresses suicidal ideation.     ED Treatments / Results  Labs (all labs ordered are listed, but only abnormal results are displayed) Labs Reviewed  COMPREHENSIVE METABOLIC PANEL - Abnormal; Notable for the following:       Result Value   Chloride 112 (*)    All other components within normal limits  ACETAMINOPHEN LEVEL - Abnormal; Notable for the following:    Acetaminophen (Tylenol), Serum <10 (*)    All other components within normal limits  CBC - Abnormal; Notable for the following:    WBC 10.9 (*)    All other components within normal limits  ETHANOL  SALICYLATE LEVEL  RAPID URINE DRUG SCREEN, HOSP PERFORMED   Results for orders placed or performed during the hospital encounter of 01/01/17  Comprehensive metabolic panel  Result Value Ref Range   Sodium 143 135 - 145 mmol/L   Potassium 3.8 3.5 - 5.1 mmol/L   Chloride 112 (H) 101 - 111 mmol/L   CO2 23 22 - 32 mmol/L   Glucose, Bld 95 65 - 99 mg/dL   BUN 12 6 - 20 mg/dL   Creatinine, Ser 0.76 0.44 - 1.00 mg/dL   Calcium 9.3 8.9 - 10.3 mg/dL   Total Protein 7.6 6.5 - 8.1 g/dL   Albumin 3.8 3.5 - 5.0 g/dL   AST 29 15 - 41 U/L   ALT 20 14 - 54 U/L   Alkaline Phosphatase 106 38 - 126 U/L   Total Bilirubin 0.4 0.3 - 1.2 mg/dL   GFR calc non Af Amer >60 >60 mL/min   GFR calc Af Amer >60 >60  mL/min   Anion gap 8 5 - 15  Ethanol  Result Value Ref Range   Alcohol, Ethyl (B) <5 <5 mg/dL  Salicylate level  Result Value Ref Range   Salicylate Lvl <5.8 2.8 - 30.0 mg/dL  Acetaminophen level  Result Value Ref Range   Acetaminophen (Tylenol), Serum <10 (L) 10 - 30 ug/mL  cbc  Result Value Ref Range   WBC 10.9 (H) 4.0 - 10.5 K/uL   RBC 4.39 3.87 - 5.11 MIL/uL   Hemoglobin 13.0 12.0 -  15.0 g/dL   HCT 39.0 36.0 - 46.0 %   MCV 88.8 78.0 - 100.0 fL   MCH 29.6 26.0 - 34.0 pg   MCHC 33.3 30.0 - 36.0 g/dL   RDW 13.6 11.5 - 15.5 %   Platelets 248 150 - 400 K/uL    EKG  EKG Interpretation None       Radiology No results found.  Procedures Procedures (including critical care time)  Medications Ordered in ED Medications  nicotine (NICODERM CQ - dosed in mg/24 hours) patch 21 mg (not administered)  ibuprofen (ADVIL,MOTRIN) tablet 600 mg (not administered)     Initial Impression / Assessment and Plan / ED Course  I have reviewed the triage vital signs and the nursing notes.  Pertinent labs & imaging results that were available during my care of the patient were reviewed by me and considered in my medical decision making (see chart for details).     Patient with depression and SI without plan. H/o attempt. TTS consultation requested to determine disposition.    Patient has been [facility] to [facility] for further treatment.   Final Clinical Impressions(s) / ED Diagnoses   Final diagnoses:  None   1. Suicidal ideation 2. Cocaine abuse  New Prescriptions New Prescriptions   No medications on file     Charlann Lange, Hershal Coria 01/02/17 0153    Charlann Lange, PA-C 29/52/84 1324    Delora Fuel, MD 40/10/27 [phone]

## 2017-01-02 NOTE — Plan of Care (Signed)
Problem: Safety: Goal: Periods of time without injury will increase Outcome: Progressing Pt free from injury this shift. Pt verbally contracts for safety.
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
## 2017-01-02 NOTE — Social Work (Signed)
Referred to [facility], is [place] resident.  Anne Cunningham, LCSW Lead Clinical Social Worker Phone:  [phone]  
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
## 2017-01-02 NOTE — [facility] Group Notes (Signed)
[place] LCSW Group Therapy  01/02/2017 1:25 PM  Type of Therapy:  Group Therapy  Participation Level:  Active  Participation Quality:  Attentive  Affect:  Appropriate  Cognitive:  Alert and Oriented  Insight:  Engaged  Engagement in Therapy:  Engaged  Modes of Intervention:  Confrontation, Discussion, Education, Socialization and Support  Summary of Progress/Problems: Today's Topic: Overcoming Obstacles. Patients identified one short term goal and potential obstacles in reaching this goal. Patients processed barriers involved in overcoming these obstacles. Patients identified steps necessary for overcoming these obstacles and explored motivation (internal and external) for facing these difficulties head on.   Patricia Hall N Smart LCSW 01/02/2017, 1:25 PM

## 2017-01-02 NOTE — Progress Notes (Signed)
Patient ID: Patricia Hall, female   DOB: Jun 10, 1966, 51 y.o.   MRN: [id number] PER STATE REGULATIONS 482.30  THIS CHART WAS REVIEWED FOR MEDICAL NECESSITY WITH RESPECT TO THE PATIENT'S ADMISSION/DURATION OF STAY.  NEXT REVIEW DATE: 01/06/17  Roma Schanz, RN, BSN CASE MANAGER
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
## 2017-01-02 NOTE — Tx Team (Signed)
Initial Treatment Plan 01/02/2017 5:48 AM [name] JGG:[id number]    PATIENT STRESSORS: Loss of housing Medication change or noncompliance Substance abuse Other: Volatile Situation with Past Roomates   PATIENT STRENGTHS: Ability for insight Active sense of humor Average or above average intelligence Communication skills General fund of knowledge Motivation for treatment/growth Supportive family/friends   PATIENT IDENTIFIED PROBLEMS: Bipolar  "Get balanced and stable"  Suicidal Ideation   Non adherent to medications "Get medicines"  Loss of Housing               DISCHARGE CRITERIA:  Ability to meet basic life and health needs Improved stabilization in mood, thinking, and/or behavior Need for constant or close observation no longer present Safe-care adequate arrangements made Verbal commitment to aftercare and medication compliance  PRELIMINARY DISCHARGE PLAN: Attend 12-step recovery group Placement in alternative living arrangements  PATIENT/FAMILY INVOLVEMENT: This treatment plan has been presented to and reviewed with the patient, Patricia Hall . The patient and family have been given the opportunity to ask questions and make suggestions.  Elenore Rota, RN 01/02/2017, 5:48 AM

## 2017-01-02 NOTE — ED Notes (Signed)
Patient is alert and oriented x3.  She was given DC instructions and follow up visit instructions.  Patient gave verbal understanding. She was DC ambulatory under her own power to [facility].  V/S stable.  He was not showing any signs of distress on DC

## 2017-01-02 NOTE — Tx Team (Signed)
Interdisciplinary Treatment and Diagnostic Plan Update  01/02/2017 Time of Session: 0930 Patricia Hall MRN: [id number]  Principal Diagnosis: Substance induced mood disorder ([place])  Secondary Diagnoses: Principal Problem:   Substance induced mood disorder ([place]) Active Problems:   Suicidal ideation   Current Medications:  Current Facility-Administered Medications  Medication Dose Route Frequency Provider Last Rate Last Dose  . acetaminophen (TYLENOL) tablet 650 mg  650 mg Oral Q6H PRN Laverle Hobby, PA-C      . alum & mag hydroxide-simeth (MAALOX/MYLANTA) 200-200-20 MG/5ML suspension 30 mL  30 mL Oral Q4H PRN Laverle Hobby, PA-C      . hydrOXYzine (ATARAX/VISTARIL) tablet 25 mg  25 mg Oral TID PRN [name], PA-C   25 mg at 01/02/17 3353  . lamoTRIgine (LAMICTAL) tablet 25 mg  25 mg Oral Daily [name], PA-C   25 mg at 01/02/17 0850  . magnesium hydroxide (MILK OF MAGNESIA) suspension 30 mL  30 mL Oral Daily PRN [name], PA-C      . nicotine (NICODERM CQ - dosed in mg/24 hours) patch 21 mg  21 mg Transdermal Daily [name], PA-C   21 mg at 01/02/17 0850  . OLANZapine (ZYPREXA) tablet 5 mg  5 mg Oral QHS Laverle Hobby, PA-C      . [START ON 01/03/2017] pneumococcal 23 valent vaccine (PNU-IMMUNE) injection 0.5 mL  0.5 mL Intramuscular Tomorrow-1000 Simon, Spencer E, PA-C      . rosuvastatin (CRESTOR) tablet 10 mg  10 mg Oral QHS Simon, Spencer E, PA-C      . traZODone (DESYREL) tablet 50 mg  50 mg Oral QHS,MR X 1 Simon, Spencer E, PA-C       PTA Medications: Prescriptions Prior to Admission  Medication Sig Dispense Refill Last Dose  . cholecalciferol 1000 UNITS tablet Take 1 tablet (1,000 Units total) by mouth daily. (Patient not taking: Reported on 01/01/2017) 30 tablet 0 Not Taking at Unknown time  . lamoTRIgine (LAMICTAL) 25 MG tablet Take 1 tablet (25 mg total) by mouth daily. (Patient not taking: Reported on 01/01/2017) 30 tablet 0 Not Taking at  Unknown time  . lurasidone 60 MG TABS Take 60 mg by mouth at bedtime. (Patient not taking: Reported on 01/01/2017) 30 tablet 0 Not Taking at Unknown time  . nicotine (NICODERM CQ - DOSED IN MG/24 HOURS) 21 mg/24hr patch Place 1 patch (21 mg total) onto the skin daily. (Patient not taking: Reported on 01/01/2017) 28 patch 0 Not Taking at Unknown time  . pregabalin (LYRICA) 75 MG capsule Take 1 capsule (75 mg total) by mouth 2 (two) times daily. (Patient not taking: Reported on 01/01/2017) 60 capsule 0 Not Taking at Unknown time  . rosuvastatin (CRESTOR) 10 MG tablet Take 1 tablet (10 mg total) by mouth at bedtime. (Patient not taking: Reported on 01/01/2017) 30 tablet 0 Not Taking at Unknown time  . traZODone (DESYREL) 50 MG tablet Take 0.5 tablets (25 mg total) by mouth at bedtime as needed for sleep. (Patient not taking: Reported on 01/01/2017) 30 tablet 0 Not Taking at Unknown time    Patient Stressors: Loss of housing Medication change or noncompliance Substance abuse Other: Volatile Situation with Past Roomates  Patient Strengths: Ability for insight Active sense of humor Average or above average intelligence Communication skills General fund of knowledge Motivation for treatment/growth Supportive family/friends  Treatment Modalities: Medication Management, Group therapy, Case management,  1 to 1 session with clinician, Psychoeducation, Recreational therapy.  Physician Treatment Plan for Primary Diagnosis: Substance induced mood disorder ([place]) Long Term Goal(s): Improvement in symptoms so as ready for discharge Improvement in symptoms so as ready for discharge   Short Term Goals: Ability to identify changes in lifestyle to reduce recurrence of condition will improve Ability to demonstrate self-control will improve Compliance with prescribed medications will improve Ability to identify triggers associated with substance abuse/mental health issues will improve Ability to disclose and  discuss suicidal ideas Ability to identify and develop effective coping behaviors will improve  Medication Management: Evaluate patient's response, side effects, and tolerance of medication regimen.  Therapeutic Interventions: 1 to 1 sessions, Unit Group sessions and Medication administration.  Evaluation of Outcomes: Not Met  Physician Treatment Plan for Secondary Diagnosis: Principal Problem:   Substance induced mood disorder ([place]) Active Problems:   Suicidal ideation  Long Term Goal(s): Improvement in symptoms so as ready for discharge Improvement in symptoms so as ready for discharge   Short Term Goals: Ability to identify changes in lifestyle to reduce recurrence of condition will improve Ability to demonstrate self-control will improve Compliance with prescribed medications will improve Ability to identify triggers associated with substance abuse/mental health issues will improve Ability to disclose and discuss suicidal ideas Ability to identify and develop effective coping behaviors will improve     Medication Management: Evaluate patient's response, side effects, and tolerance of medication regimen.  Therapeutic Interventions: 1 to 1 sessions, Unit Group sessions and Medication administration.  Evaluation of Outcomes: Not Met   RN Treatment Plan for Primary Diagnosis: Substance induced mood disorder ([place]) Long Term Goal(s): Knowledge of disease and therapeutic regimen to maintain health will improve  Short Term Goals: Ability to remain free from injury will improve, Ability to disclose and discuss suicidal ideas and Ability to identify and develop effective coping behaviors will improve  Medication Management: RN will administer medications as ordered by provider, will assess and evaluate patient's response and provide education to patient for prescribed medication. RN will report any adverse and/or side effects to prescribing provider.  Therapeutic Interventions: 1 on 1  counseling sessions, Psychoeducation, Medication administration, Evaluate responses to treatment, Monitor vital signs and CBGs as ordered, Perform/monitor CIWA, COWS, AIMS and Fall Risk screenings as ordered, Perform wound care treatments as ordered.  Evaluation of Outcomes: Not Met   LCSW Treatment Plan for Primary Diagnosis: Substance induced mood disorder ([place]) Long Term Goal(s): Safe transition to appropriate next level of care at discharge, Engage patient in therapeutic group addressing interpersonal concerns.  Short Term Goals: Engage patient in aftercare planning with referrals and resources, Facilitate patient progression through stages of change regarding substance use diagnoses and concerns and Identify triggers associated with mental health/substance abuse issues  Therapeutic Interventions: Assess for all discharge needs, 1 to 1 time with Social worker, Explore available resources and support systems, Assess for adequacy in community support network, Educate family and significant other(s) on suicide prevention, Complete Psychosocial Assessment, Interpersonal group therapy.  Evaluation of Outcomes: Not Met   Progress in Treatment: Attending groups: No. New to unit. Continuing to assess.  Participating in groups: No. Taking medication as prescribed: Yes. Toleration medication: Yes. Family/Significant other contact made: No, will contact:  family member if patient consents Patient understands diagnosis: Yes. Discussing patient identified problems/goals with staff: Yes. Medical problems stabilized or resolved: Yes. Denies suicidal/homicidal ideation: No. Passive SI/Able to contract for safety on the unit. Issues/concerns per patient self-inventory: No. Other: n/a   New problem(s) identified: No, Describe:  n/a  New Short Term/Long Term Goal(s): detox, medication stabilization, elimination of SI thoughts, development of comprehensive mental wellness/sobriety plan.   Discharge  Plan or Barriers: CSW assessing for appropriate referrals.--pt interested in [facility] at discharge and has hx at [facility]. Pt identifies as homeless and has been staying with a friend. Last admission to [facility] was 02/04/2015.  Reason for Continuation of Hospitalization: Anxiety Depression Medication stabilization Suicidal ideation Withdrawal symptoms  Estimated Length of Stay: 3-5 days   Attendees: Patient: 01/02/2017 3:18 PM  Physician: Dr. Sanjuana Letters MD 01/02/2017 3:18 PM  Nursing: Birdie Hopes RN 01/02/2017 3:18 PM  RN Care Manager: Lars Pinks CM 01/02/2017 3:18 PM  Social Worker: Maxie Better, LCSW 01/02/2017 3:18 PM  Recreational Therapist: x 01/02/2017 3:18 PM  Other: Lindell Spar NP 01/02/2017 3:18 PM  Other:  01/02/2017 3:18 PM  Other: 01/02/2017 3:18 PM    Scribe for Treatment Team: [place], LCSW 01/02/2017 3:18 PM

## 2017-01-03 LAB — RPR: RPR: NONREACTIVE

## 2017-01-03 LAB — GC/CHLAMYDIA PROBE AMP (~~LOC~~) NOT AT ARMC
Chlamydia: NEGATIVE
Neisseria Gonorrhea: NEGATIVE

## 2017-01-03 LAB — HIV ANTIBODY (ROUTINE TESTING W REFLEX): HIV SCREEN 4TH GENERATION: NONREACTIVE

## 2017-01-03 LAB — PROLACTIN: Prolactin: 14.8 ng/mL (ref 4.8–23.3)

## 2017-01-03 LAB — HEPATITIS C ANTIBODY

## 2017-01-03 MED ORDER — GLYCERIN (LAXATIVE) 2.1 G RE SUPP
1.0000 | Freq: Every day | RECTAL | Status: DC | PRN
  Administered 2017-01-03 – 2017-01-06 (×2): 1 via RECTAL
  Filled 2017-01-03 (×4): qty 1
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
## 2017-01-03 NOTE — Progress Notes (Signed)
Recreation Therapy Notes  Date: 01/03/17 Time: 0930 Location: [address] Dayroom  Group Topic: Stress Management  Goal Area(s) Addresses:  Patient will verbalize importance of using healthy stress management.  Patient will identify positive emotions associated with healthy stress management.   Behavioral Response: Engaged  Intervention: Stress Management  Activity : Progressive Muscle Relaxation.  LRT introduced the stress management technique of progressive muscle relaxation.  LRT read a script to guide patients through the process of pmr so they could get the full scope of the technique.  Patients were to follow along as the script was read to fully engage in technique.   Education:  Stress Management, Discharge Planning.   Education Outcome: Acknowledges edcuation/In group clarification offered/Needs additional education  Clinical Observations/Feedback: Pt attended group.   Patricia Hall, LRT/CTRS         Ria Comment, Caelan Atchley A 01/03/2017 11:20 AM

## 2017-01-03 NOTE — Plan of Care (Signed)
Problem: Coping: Goal: Ability to demonstrate self-control will improve Outcome: Progressing Pt attends group, interactions appropriate 01/03/17

## 2017-01-03 NOTE — Progress Notes (Signed)
Patient ID: Patricia Hall, female   DOB: 1965/10/27, 51 y.o.   MRN: [id number]  Pt currently presents with a masked affect and anxious behavior. Pt preoccupied with medications, states "the Neurontin did not work today." Seen interacting with peers tonight, attended [place] group. Pt reports good sleep with current medication regimen.   Pt provided with medications per providers orders. Pt's labs and vitals were monitored throughout the night. Pt given a 1:1 about emotional and mental status. Pt supported and encouraged to express concerns and questions. Pt educated on psychiatric medications and time of efficacy.   Pt's safety ensured with 15 minute and environmental checks. Pt currently endorses passive SI, no plan. Pt denies HI and A/V hallucinations. Pt verbally agrees to seek staff if SI worsens, HI or A/VH occurs and to consult with staff before acting on any harmful thoughts. Pt behavior is still impulsive. Will continue POC.

## 2017-01-03 NOTE — Progress Notes (Signed)
Nursing Progress Note: 7p-7a D: Pt currently presents with a animated/pleasant/anxious affect and behavior. Pt states "I had a good day. I want to feel better and be better." Interacting appropriately with milieu. Pt reports fair sleep with current medication regimen.   A: Pt provided with medications per providers orders. Pt's labs and vitals were monitored throughout the night. Pt supported emotionally and encouraged to express concerns and questions. Pt educated on medications.  R: Pt's safety ensured with 15 minute and environmental checks. Pt currently denies SI/HI/Self Harm and AVH. Pt verbally contracts to seek staff if SI/HI or A/VH occurs and to consult with staff before acting on any harmful thoughts. Will continue to monitor.

## 2017-01-03 NOTE — Progress Notes (Signed)
[name] is feeling better today. She requested " something for the constipation today" and was given a glycerin suppository and about 2 hrs later she said " I feell so much better.Marland Kitchenibuprofen finally ahd a bm".  A  She completed her daily assessment and on it she wrote she has experienced SI today and but she contracts with this Probation officer to not hurt herself. SHE rated her depression, hopelessness and anxiety.  R Safety I place.

## 2017-01-03 NOTE — BHH Group Notes (Signed)
[facility] LCSW Group Therapy  01/03/2017 4:02 PM  Type of Therapy:  Group Therapy  Participation Level:  Active  Participation Quality:  Attentive  Affect:  Appropriate and Excited  Cognitive:  Oriented  Insight:  Improving  Engagement in Therapy:  Engaged and Improving  Modes of Intervention:  Confrontation, Discussion, Education, Socialization and Support  Summary of Progress/Problems: Feelings around Relapse. Group members discussed the meaning of relapse and shared personal stories of relapse, how it affected them and others, and how they perceived themselves during this time. Group members were encouraged to identify triggers, warning signs and coping skills used when facing the possibility of relapse. Social supports were discussed and explored in detail. Post Acute Withdrawal Syndrome (handout provided) was introduced and examined. Pt's were encouraged to ask questions, talk about key points associated with PAWS, and process this information in terms of relapse prevention.   Rylynne Schicker N Smart LCSW 01/03/2017, 4:02 PM

## 2017-01-04 NOTE — BHH Group Notes (Signed)
[facility] Group Notes:  (Nursing/MHT/Case Management/Adjunct)  Date:  01/04/2017  Time:  3:00 PM  Type of Therapy:  Nurse Education  Participation Level:  Active  Participation Quality:  Appropriate and Attentive  Affect:  Appropriate  Cognitive:  Alert and Appropriate  Insight:  Appropriate  Engagement in Group:  Engaged  Modes of Intervention:  Activity, Discussion and Education  Summary of Progress/Problems: Today's group focused on different perspectives. Patient's were asked to identify an irrational thought. Pictures with different illusions were then passed around and patient's were asked to reveal what they saw.  A conversation was had about how in every situation there are many ways to look at the same problem and how we always have a choice to see things in a different perspective.  At the end of the group, patient's were asked to repeat their irrational thought and replace it with a different positive statement.  Patricia Hall attended and actively participated in group and offered support to peers.  Patricia Hall 01/04/2017, 3:00 PM

## 2017-01-04 NOTE — Progress Notes (Signed)
Patient did attend the evening speaker AA meeting.  

## 2017-01-04 NOTE — BHH Group Notes (Signed)
[facility] LCSW Group Therapy Note  01/04/2017  and  10:15 AM  Type of Therapy and Topic:  Group Therapy: Avoiding Self-Sabotaging and Enabling Behaviors  Participation Level:  Active  Participation Quality:  Attentive and Sharing  Affect:  Defensive  Cognitive:  Oriented  Insight:  Limited  Engagement in Therapy:  Monopolizing   Therapeutic models used: Cognitive Behavioral Therapy,  Person-Centered Therapy and Motivational Interviewing  Modes of Intervention:  Discussion, Exploration, Orientation, Rapport Building, Socialization and Support   Summary of Progress/Problems:  The main focus of today's process group was for the patient to identify ways in which they have in the past sabotaged their own recovery. Motivational Interviewing was utilized to identify motivation they may have for wanting to change. The Stages of Change were explained using a handout, and patients identified where they currently are with regard to stages of change. Patient was excessively talkative in group and insistent that things will not change for her. Patient experienced difficulty in considering "even the possibility that she may be able to change her way of thinking." Patient was asked to simply say thank you as others gave her a compliment for the remainder of the day.    Sheilah Pigeon, LCSW

## 2017-01-04 NOTE — Progress Notes (Signed)
The patient attended last evening's A.A.meeting and was appropriate.

## 2017-01-04 NOTE — Progress Notes (Signed)
Pt attended AA group 

## 2017-01-04 NOTE — Progress Notes (Signed)
Nursing Note: 0700-1900  D:  Pt presents with anxious mood and animated affect.  States that she feels hyper today and that she loves attention.  "My self- esteem is so low that I act funny to get attention, I love attention."  Rates that her sense of hopelessness is 7/10, and anxiety is 6/10. Her most important goal for today is "getting my mood stable and and getting myself balanced."  A:  Encouraged to verbalize needs and concerns, active listening and support provided.  Continued Q 15 minute safety checks.    R:  Pt. is pleasant and cooperative.  She denies A/V hallucinations and is able to verbally contract for safety.

## 2017-01-04 NOTE — Progress Notes (Signed)
[facility] MD Progress Note  01/04/2017 2:25 PM Patricia Hall  MRN:  [id number] Subjective:   51 yo Caucasian female, single  homeless, unemployed. Background history of SUD and Mood disorder. Self presented to the ER. Expressed thoughts of suicide. No specific intent. Reports disgust with self for relapsing on substances. UDS is positive for cocaine and THC. Other parameters are within normal limit.  Chart reviewed today. Patient discussed at team  Staff reports that she has been pleasant and appropriate. No behavioral issues. She has been interacting well with peers. She has been engaging at groups. No observable or reported side effects from her medications.  Seen today. In good spirits. Says she has been getting along well with peers. No concerns. No evidence of mania. No evidence of depression. No evidence of psychosis. No thoughts of violence. Remains hopeful. No issues with her medications.   Principal Problem: Substance induced mood disorder ([place]) Diagnosis:   Patient Active Problem List   Diagnosis Date Noted  . Substance induced mood disorder ([place]) [F19.94] 01/02/2017  . Bipolar disorder, current episode depressed, severe, without psychotic features ([place]) [F31.4] 02/04/2015  . Bipolar affective disorder, depressed ([place]) [F31.30] 02/04/2015  . Depression [F32.9]   . Suicidal ideation [R45.851]   . Paresthesia [R20.2] 09/01/2014  . Chronic daily headache [R51] 09/01/2014  . Acute cholecystitis [K81.0] 05/12/2012   Total Time spent with patient: 20 minutes  Past Psychiatric History: As in H&P  Past Medical History:  Past Medical History:  Diagnosis Date  . Bipolar 1 disorder ([place])   . Cholecystitis 05/12/2012  . Chronic daily headache 09/01/2014  . Hep C w/ coma, chronic ([place])   . Hyperlipemia   . Obese   . Paresthesia 09/01/2014  . Substance abuse   . Substance abuse    cocaine  . Vitamin D deficiency     Past Surgical History:  Procedure Laterality Date  . APPENDECTOMY     . CHOLECYSTECTOMY  05/12/2012   Procedure: LAPAROSCOPIC CHOLECYSTECTOMY WITH INTRAOPERATIVE CHOLANGIOGRAM;  Surgeon: Ralene Ok, MD;  Location: [facility];  Service: General;  Laterality: N/A;  . EYE SURGERY     strabismus surgery   Family History:  Family History  Problem Relation Age of Onset  . Heart disease Mother   . Diabetes Mother   . Diabetes Father   . Heart disease Father   . Diabetes Sister   . Diabetes Brother    Family Psychiatric  History: As in H&P Social History:  History  Alcohol Use  . Yes    Comment: occasional     History  Drug Use  . Types: "Crack" cocaine, Marijuana    Comment: daily cocaine use. as much as I can get.     Social History   Social History  . Marital status: Legally Separated    Spouse name: N/A  . Number of children: N/A  . Years of education: N/A   Social History Main Topics  . Smoking status: Current Every Day Smoker    Packs/day: 1.00    Years: 25.00    Types: Cigarettes  . Smokeless tobacco: Never Used  . Alcohol use Yes     Comment: occasional  . Drug use: Yes    Types: "Crack" cocaine, Marijuana     Comment: daily cocaine use. as much as I can get.   Marland Kitchen Sexual activity: Yes    Birth control/ protection: Condom   Other Topics Concern  . None   Social History Narrative  . None  Additional Social History:    Pain Medications: denies any medications since 06/2016 Name of Substance 1: Crack cocaine 1 - Age of First Use: "young" 1 - Amount (size/oz): "dime bag" 1 - Frequency: 3-4 times in a week 1 - Duration: off and on 1 - Last Use / Amount: 01/01/17 Name of Substance 2: Marijuana 2 - Age of First Use: teens 2 - Amount (size/oz): "puff or two" 2 - Frequency: daily 2 - Duration: off and on 2 - Last Use / Amount: 06/20       Sleep: Good  Appetite:  Good  Current Medications: Current Facility-Administered Medications  Medication Dose Route Frequency Provider Last Rate Last Dose  . acetaminophen  (TYLENOL) tablet 650 mg  650 mg Oral Q6H PRN Laverle Hobby, PA-C      . alum & mag hydroxide-simeth (MAALOX/MYLANTA) 200-200-20 MG/5ML suspension 30 mL  30 mL Oral Q4H PRN [name], PA-C      . gabapentin (NEURONTIN) capsule 300 mg  300 mg Oral TID [name], MD   300 mg at 01/04/17 1128  . Glycerin (Adult) 2.1 g suppository 1 suppository  1 suppository Rectal Daily PRN [name], MD   1 suppository at 01/03/17 1149  . hydrOXYzine (ATARAX/VISTARIL) tablet 25 mg  25 mg Oral TID PRN Laverle Hobby, PA-C   25 mg at 01/02/17 2209  . magnesium hydroxide (MILK OF MAGNESIA) suspension 30 mL  30 mL Oral Daily PRN Laverle Hobby, PA-C   30 mL at 01/03/17 7412  . nicotine (NICODERM CQ - dosed in mg/24 hours) patch 21 mg  21 mg Transdermal Daily [name], PA-C   21 mg at 01/04/17 0802  . OLANZapine (ZYPREXA) tablet 5 mg  5 mg Oral QHS [name], PA-C   5 mg at 01/03/17 2142  . rosuvastatin (CRESTOR) tablet 10 mg  10 mg Oral QHS Laverle Hobby, PA-C   10 mg at 01/03/17 2142  . traZODone (DESYREL) tablet 50 mg  50 mg Oral QHS,MR X 1 Laverle Hobby, PA-C   50 mg at 01/03/17 2143    Lab Results:  Results for orders placed or performed during the hospital encounter of 01/02/17 (from the past 48 hour(s))  Urinalysis, Routine w reflex microscopic     Status: Abnormal   Collection Time: 01/02/17  3:57 PM  Result Value Ref Range   Color, Urine YELLOW YELLOW   APPearance CLEAR CLEAR   Specific Gravity, Urine 1.013 1.005 - 1.030   pH 6.0 5.0 - 8.0   Glucose, UA NEGATIVE NEGATIVE mg/dL   Hgb urine dipstick SMALL (A) NEGATIVE   Bilirubin Urine NEGATIVE NEGATIVE   Ketones, ur NEGATIVE NEGATIVE mg/dL   Protein, ur NEGATIVE NEGATIVE mg/dL   Nitrite NEGATIVE NEGATIVE   Leukocytes, UA NEGATIVE NEGATIVE   RBC / HPF 0-5 0 - 5 RBC/hpf   WBC, UA 0-5 0 - 5 WBC/hpf   Bacteria, UA NONE SEEN NONE SEEN   Squamous Epithelial / LPF 0-5 (A) NONE SEEN   Mucous PRESENT      Comment: Performed at [facility], [place] [address], [place], [place] [address]  Pregnancy, urine     Status: None   Collection Time: 01/02/17  3:57 PM  Result Value Ref Range   Preg Test, Ur NEGATIVE NEGATIVE    Comment:        THE SENSITIVITY OF THIS METHODOLOGY IS >20 mIU/mL. Performed at [facility],  [place] [address], [place], [place] [address]   Lipid panel     Status: Abnormal   Collection Time: 01/02/17  6:10 PM  Result Value Ref Range   Cholesterol 240 (H) 0 - 200 mg/dL   Triglycerides 115 <150 mg/dL   HDL 86 >40 mg/dL   Total CHOL/HDL Ratio 2.8 RATIO   VLDL 23 0 - 40 mg/dL   LDL Cholesterol 131 (H) 0 - 99 mg/dL    Comment:        Total Cholesterol/HDL:CHD Risk Coronary Heart Disease Risk Table                     Men   Women  1/2 Average Risk   3.4   3.3  Average Risk       5.0   4.4  2 X Average Risk   9.6   7.1  3 X Average Risk  23.4   11.0        Use the calculated Patient Ratio above and the CHD Risk Table to determine the patient's CHD Risk.        ATP III CLASSIFICATION (LDL):  <100     mg/dL   Optimal  100-129  mg/dL   Near or Above                    Optimal  130-159  mg/dL   Borderline  160-189  mg/dL   High  >190     mg/dL   Very High Performed at [facility] [address] [place], [place] [address]   TSH     Status: None   Collection Time: 01/02/17  6:10 PM  Result Value Ref Range   TSH 1.204 0.350 - 4.500 uIU/mL    Comment: Performed by a 3rd Generation assay with a functional sensitivity of <=0.01 uIU/mL. Performed at [facility], [place] [address], [place], [place] [address]   Prolactin     Status: None   Collection Time: 01/02/17  6:10 PM  Result Value Ref Range   Prolactin 14.8 4.8 - 23.3 ng/mL    Comment: (NOTE) Performed At: [facility] [place], [place] [id number] Lindon Romp MD [id number] Performed at [facility], [place] [address] [place], [place] [address]   Hepatitis C antibody     Status: Abnormal   Collection Time: 01/02/17  6:10 PM  Result Value Ref Range   HCV Ab >11.0 (H) 0.0 - 0.9 s/co ratio    Comment: (NOTE)                                  Negative:     < 0.8                             Indeterminate: 0.8 - 0.9                                  Positive:     > 0.9 The CDC recommends that a positive HCV antibody result be followed up with a HCV Nucleic Acid Amplification test (542706). Performed At: [facility] [place], [place] [id number] Lindon Romp MD [id number] Performed at [facility], [place] [address] [place], [place]  [address]   HIV antibody     Status: None   Collection Time: 01/02/17  6:10 PM  Result Value Ref Range   HIV Screen 4th Generation wRfx Non Reactive Non Reactive    Comment: (NOTE) Performed At: [facility] [place], [place] [id number] Lindon Romp MD [id number] Performed at [facility], [place] [address] [place], [place] [address]   RPR     Status: None   Collection Time: 01/02/17  6:10 PM  Result Value Ref Range   RPR Ser Ql Non Reactive Non Reactive    Comment: (NOTE) Performed At: [facility] [place], [place] [id number] Lindon Romp MD [id number] Performed at [facility], [place] [address] [place], [place] [address]     Blood Alcohol level:  Lab Results  Component Value Date   [facility] <5 01/01/2017   ETH <5 17/40/8144    Metabolic Disorder Labs: Lab Results  Component Value Date   HGBA1C 6.2 (H) 02/09/2015   MPG 131 02/09/2015   Lab Results  Component Value Date   PROLACTIN 14.8 01/02/2017   Lab Results  Component Value Date   CHOL 240 (H) 01/02/2017   TRIG 115 01/02/2017   HDL 86 01/02/2017   CHOLHDL 2.8 01/02/2017   VLDL 23 01/02/2017   LDLCALC 131 (H) 01/02/2017   LDLCALC 85 02/09/2015     Physical Findings: AIMS:  , ,  ,  , Dental Status Current problems with teeth and/or dentures?: Yes Does patient usually wear dentures?: No  CIWA:    COWS:     Musculoskeletal: Strength & Muscle Tone: within normal limits Gait & Station: normal Patient leans: N/A  Psychiatric Specialty Exam: Physical Exam  Constitutional: She is oriented to person, place, and time. She appears well-developed and well-nourished.  HENT:  Head: Normocephalic.  Eyes: Pupils are equal, round, and reactive to light.  Neck: Normal range of motion. Neck supple.  Respiratory: Effort normal.  Musculoskeletal: Normal range of motion.  Neurological: She is alert and oriented to person, place, and time.  Skin: Skin is warm.  Psychiatric:  As above    ROS  Blood pressure 93/64, pulse 94, temperature 97.9 F (36.6 C), resp. rate 17, height 5' 1.5" (1.562 m), weight 86.2 kg (190 lb), SpO2 96 %.Body mass index is 35.32 kg/m.  General Appearance: Pleasant, engages and relates well.   Eye Contact:  Good  Speech:  Spontaneous, normal prosody. Normal tone and rate.   Volume:  Normal  Mood:  Euthymic  Affect:  Appropriate and Full Range  Thought Process:  Linear  Orientation:  Full (Time, Place, and Person)  Thought Content:  Future oriented. No delusional theme. No preoccupation with violent thoughts. No negative ruminations. No obsession.  No hallucination in any modality.   Suicidal Thoughts:  No  Homicidal Thoughts:  No  Memory:  WNL  Judgement:  Good  Insight:  Good  Psychomotor Activity:  Normal  Concentration:  Concentration: Good and Attention Span: Good  Recall:  Good  Fund of Knowledge:  Good  Language:  Good  Akathisia:  No  Handed:    AIMS (if indicated):     Assets:  Communication Skills Desire for Improvement Resilience  ADL's:  Intact  Cognition:  WNL  Sleep:  Number of Hours: 6.5     Treatment Plan Summary: Patient has completely come off street substances. She is not  manic or depressed. She is not psychotic. She is  not a danger to herself or others. She has been accepted at [facility]. Her bed would be available on Tuesday.  Psychiatric: SUD Substance Induced Mood Disorder  Medical:  Psychosocial:  Homelessness Limited   PLAN: 1. Continue current regimen 2. Continue to monitor mood, behavior and interaction with peers 3. Discharge on Tuesday   [name], MD 01/04/2017, 2:25 PMPatient ID: Patricia Hall, female   DOB: 30-Nov-1965, 51 y.o.   MRN: [id number]

## 2017-01-05 MED ORDER — NICOTINE POLACRILEX 2 MG MT GUM
2.0000 mg | CHEWING_GUM | OROMUCOSAL | Status: DC | PRN
  Administered 2017-01-05 – 2017-01-06 (×2): 2 mg via ORAL

## 2017-01-05 MED ORDER — OLANZAPINE 10 MG PO TABS
10.0000 mg | ORAL_TABLET | Freq: Every day | ORAL | Status: DC
  Administered 2017-01-05 – 2017-01-06 (×2): 10 mg via ORAL
  Filled 2017-01-05 (×2): qty 1
  Filled 2017-01-05: qty 14
  Filled 2017-01-05: qty 1

## 2017-01-05 NOTE — Progress Notes (Signed)
D: Patient has been talkative this evening. Reports leg pain of 3/10 from "arthritis". Patient stated she will like a change of medicine because Vistaril "does me no good". Denies SI/HI, AH/VH at this time. Interacted well with peers on dayroom. No behavior issues noted.  A: Staff offered support and encouragement as needed. Due med given as ordered. Routine safety checks maintained. Will continue to monitor patient. R: Patient is cooperative on unit.

## 2017-01-05 NOTE — Progress Notes (Signed)
D:  Patient denied SI, contracts for safety.  HI thoughts to another patient this morning who is very loud and intrusive, no plan, contracts for safety.  Denied A/V hallucinations. A:  Medications administered per MD orders.  Emotional support and encouragement given patient. R:  Safety maintained with 15 minute checks.

## 2017-01-05 NOTE — Progress Notes (Signed)
Patient did attend the evening speaker AA meeting.  

## 2017-01-05 NOTE — BHH Group Notes (Signed)
[facility] LCSW Group Therapy  01/05/2017 10 AM  Type of Therapy:  Group Therapy  Participation Level:  Minimal; in and out of group room  Participation Quality:  Inattentive  Affect:  Flat  Cognitive:  Oriented  Insight:  None shared  Engagement in Therapy:  None noted  Modes of Intervention:  Discussion, Education, Exploration, Problem-solving, Rapport Building, Socialization and Support  Summary of Progress/Problems: Topic for today was thoughts and feelings regarding discharge. We discussed fears of upcoming changes including judgements, expectations and stigma of mental health issues. We then discussed supports: what constitutes a supportive framework, identification of supports and what to do when others are not supportive. Patients then discussed different options to deal with anger. Patient was in and out of group room and occupied with coloring when present. Patient later had conversation with CSW and shared she felt "group was talking about her in a derogatory manner." CSW assured pt she was not the focus of attention  Patricia Pigeon, LCSW

## 2017-01-05 NOTE — BHH Group Notes (Signed)
The focus of this group is to educate the patient on the purpose and policies of crisis stabilization and provide a format to answer questions about their admission.  The group details unit policies and expectations of patients while admitted.  Patient actively participated and had appropriate affect.  Patient was alert.  Patient had appropriate insight and appropriate engagement.  Today patient will work on 3 goals for discharge. Patient had wonderful insight during group that she shared with other patients.  Beautiful spirit to encourage other patients to pursue their life goals to help other people who are going through difficult family situations which involve drugs/alcohol, etc.

## 2017-01-05 NOTE — Progress Notes (Signed)
D- Patient presents in a anxious mood and is animated. Denies SI, HI, and AVH.  Patient reports that she was feeling suicidal yesterday "but not to day".  Patient verbalized disappointment this shift when her expected visitor did not show up during visitation.  Patient reports that she is not use to her medication stating "it helps my legs but it has my emotions going up and down so I have to get use to it but it'll be fine".  Patient reports that her mood feels "more balanced".  Patient got in the tub this shift which she reports was effective in calming her nerves. A-Support and encouragement provided.  Routine safety checks conducted every 15 minutes.  Patient informed to notify staff with problems or concerns. R- Patient contracts for safety at this time. Patient compliant with medications and treatment plan. Patient receptive, calm, and cooperative. Patient interacts well with others on the unit.  Patient remains safe at this time.

## 2017-01-05 NOTE — Progress Notes (Signed)
[facility] MD Progress Note  01/05/2017 10:21 AM Patricia Hall  MRN:  [id number] Subjective:   51 yo Caucasian female, single  homeless, unemployed. Background history of SUD and Mood disorder. Self presented to the ER. Expressed thoughts of suicide. No specific intent. Reports disgust with self for relapsing on substances. UDS is positive for cocaine and THC. Other parameters are within normal limit.  Chart reviewed today. Patient discussed at team  Staff reports that she has been participating at groups. No behavioral issues. She was a bit upset yesterday because her visitor did not show up. She responded well to warm bath. She is maintaining normal biological rhythm.   Seen today. Says she is doing well. Talked about conversations they had at groups yesterday. Says she felt offended by some peers. Describes some irritability but says she is able to navigate through difficult conversations. Her thoughts are not racing. Says she is able to focus on task. No perceptual abnormalities. Not expressing any grandiose themes. We agreed to increase her mood stabilizer today. Remains committed to inpatient addiction treatment on Tuesday.   Principal Problem: Substance induced mood disorder ([place]) Diagnosis:   Patient Active Problem List   Diagnosis Date Noted  . Substance induced mood disorder ([place]) [F19.94] 01/02/2017  . Bipolar disorder, current episode depressed, severe, without psychotic features ([place]) [F31.4] 02/04/2015  . Bipolar affective disorder, depressed ([place]) [F31.30] 02/04/2015  . Depression [F32.9]   . Suicidal ideation [R45.851]   . Paresthesia [R20.2] 09/01/2014  . Chronic daily headache [R51] 09/01/2014  . Acute cholecystitis [K81.0] 05/12/2012   Total Time spent with patient: 20 minutes  Past Psychiatric History: As in H&P  Past Medical History:  Past Medical History:  Diagnosis Date  . Bipolar 1 disorder ([place])   . Cholecystitis 05/12/2012  . Chronic daily headache 09/01/2014  .  Hep C w/ coma, chronic ([place])   . Hyperlipemia   . Obese   . Paresthesia 09/01/2014  . Substance abuse   . Substance abuse    cocaine  . Vitamin D deficiency     Past Surgical History:  Procedure Laterality Date  . APPENDECTOMY    . CHOLECYSTECTOMY  05/12/2012   Procedure: LAPAROSCOPIC CHOLECYSTECTOMY WITH INTRAOPERATIVE CHOLANGIOGRAM;  Surgeon: Ralene Ok, MD;  Location: [place];  Service: General;  Laterality: N/A;  . EYE SURGERY     strabismus surgery   Family History:  Family History  Problem Relation Age of Onset  . Heart disease Mother   . Diabetes Mother   . Diabetes Father   . Heart disease Father   . Diabetes Sister   . Diabetes Brother    Family Psychiatric  History: As in H&P Social History:  History  Alcohol Use  . Yes    Comment: occasional     History  Drug Use  . Types: "Crack" cocaine, Marijuana    Comment: daily cocaine use. as much as I can get.     Social History   Social History  . Marital status: Legally Separated    Spouse name: N/A  . Number of children: N/A  . Years of education: N/A   Social History Main Topics  . Smoking status: Current Every Day Smoker    Packs/day: 1.00    Years: 25.00    Types: Cigarettes  . Smokeless tobacco: Never Used  . Alcohol use Yes     Comment: occasional  . Drug use: Yes    Types: "Crack" cocaine, Marijuana     Comment: daily cocaine  use. as much as I can get.   Marland Kitchen Sexual activity: Yes    Birth control/ protection: Condom   Other Topics Concern  . None   Social History Narrative  . None   Additional Social History:    Pain Medications: denies any medications since 06/2016 Name of Substance 1: Crack cocaine 1 - Age of First Use: "young" 1 - Amount (size/oz): "dime bag" 1 - Frequency: 3-4 times in a week 1 - Duration: off and on 1 - Last Use / Amount: 01/01/17 Name of Substance 2: Marijuana 2 - Age of First Use: teens 2 - Amount (size/oz): "puff or two" 2 - Frequency: daily 2 -  Duration: off and on 2 - Last Use / Amount: 06/20       Sleep: Good  Appetite:  Good  Current Medications: Current Facility-Administered Medications  Medication Dose Route Frequency Provider Last Rate Last Dose  . acetaminophen (TYLENOL) tablet 650 mg  650 mg Oral Q6H PRN Laverle Hobby, PA-C      . alum & mag hydroxide-simeth (MAALOX/MYLANTA) 200-200-20 MG/5ML suspension 30 mL  30 mL Oral Q4H PRN [name], PA-C      . gabapentin (NEURONTIN) capsule 300 mg  300 mg Oral TID [name], MD   300 mg at 01/05/17 0801  . Glycerin (Adult) 2.1 g suppository 1 suppository  1 suppository Rectal Daily PRN [name], MD   1 suppository at 01/03/17 1149  . hydrOXYzine (ATARAX/VISTARIL) tablet 25 mg  25 mg Oral TID PRN [name], PA-C   25 mg at 01/05/17 7829  . magnesium hydroxide (MILK OF MAGNESIA) suspension 30 mL  30 mL Oral Daily PRN Laverle Hobby, PA-C   30 mL at 01/03/17 5621  . nicotine polacrilex (NICORETTE) gum 2 mg  2 mg Oral PRN [name], [name], MD   2 mg at 01/05/17 0804  . OLANZapine (ZYPREXA) tablet 5 mg  5 mg Oral QHS [name], PA-C   5 mg at 01/04/17 2201  . rosuvastatin (CRESTOR) tablet 10 mg  10 mg Oral QHS Laverle Hobby, PA-C   10 mg at 01/04/17 2201  . traZODone (DESYREL) tablet 50 mg  50 mg Oral QHS,MR X 1 Laverle Hobby, PA-C   50 mg at 01/04/17 2237    Lab Results:  No results found for this or any previous visit (from the past 48 hour(s)).  Blood Alcohol level:  Lab Results  Component Value Date   ETH <5 01/01/2017   ETH <5 30/86/5784    Metabolic Disorder Labs: Lab Results  Component Value Date   HGBA1C 6.2 (H) 02/09/2015   MPG 131 02/09/2015   Lab Results  Component Value Date   PROLACTIN 14.8 01/02/2017   Lab Results  Component Value Date   CHOL 240 (H) 01/02/2017   TRIG 115 01/02/2017   HDL 86 01/02/2017   CHOLHDL 2.8 01/02/2017   VLDL 23 01/02/2017   LDLCALC 131 (H) 01/02/2017   LDLCALC 85  02/09/2015    Physical Findings: AIMS: Facial and Oral Movements Muscles of Facial Expression: None, normal Lips and Perioral Area: None, normal Jaw: None, normal Tongue: None, normal,Extremity Movements Upper (arms, wrists, hands, fingers): None, normal Lower (legs, knees, ankles, toes): None, normal, Trunk Movements Neck, shoulders, hips: None, normal, Overall Severity Severity of abnormal movements (highest score from questions above): None, normal Incapacitation due to abnormal movements: None, normal Patient's awareness of abnormal movements (rate only patient's report):  No Awareness, Dental Status Current problems with teeth and/or dentures?: Yes Does patient usually wear dentures?: No  CIWA:    COWS:     Musculoskeletal: Strength & Muscle Tone: within normal limits Gait & Station: normal Patient leans: N/A  Psychiatric Specialty Exam: Physical Exam  Constitutional: She is oriented to person, place, and time. She appears well-developed and well-nourished.  HENT:  Head: Normocephalic.  Eyes: Pupils are equal, round, and reactive to light.  Neck: Normal range of motion. Neck supple.  Respiratory: Effort normal.  Musculoskeletal: Normal range of motion.  Neurological: She is alert and oriented to person, place, and time.  Skin: Skin is warm.  Psychiatric:  As above    ROS  Blood pressure 94/75, pulse (!) 111, temperature 98.8 F (37.1 C), temperature source Oral, resp. rate 20, height 5' 1.5" (1.562 m), weight 86.2 kg (190 lb), SpO2 96 %.Body mass index is 35.32 kg/m.  General Appearance: Calm and cooperative. Good relatedness. Appropriate behavior.   Eye Contact:  Good  Speech:  Spontaneous, normal prosody. Normal tone and rate.   Volume:  Normal  Mood:  Euthymic  Affect:  Appropriate and Full Range  Thought Process:  Linear  Orientation:  Full (Time, Place, and Person)  Thought Content:  Future oriented. No delusional theme. No preoccupation with violent  thoughts. No negative ruminations. No obsession.  No hallucination in any modality.   Suicidal Thoughts:  No  Homicidal Thoughts:  No  Memory:  WNL  Judgement:  Good  Insight:  Good  Psychomotor Activity:  Normal  Concentration:  Concentration: Good and Attention Span: Good  Recall:  Good  Fund of Knowledge:  Good  Language:  Good  Akathisia:  No  Handed:    AIMS (if indicated):     Assets:  Communication Skills Desire for Improvement Resilience  ADL's:  Intact  Cognition:  WNL  Sleep:  Number of Hours: 6.25     Treatment Plan Summary: Patient has completely detoxed from substances. She is slightly irritable but not frankly manic. We have agreed to increase Olanzapine tonight. Scheduled for admission into [facility] on Tuesday  Psychiatric: SUD Substance Induced Mood Disorder  Medical:  Psychosocial:  Homelessness Limited   PLAN: 1. Increase Olanzapine to 10 mg HS 2. Continue to monitor mood, behavior and interaction with peers 3. Discharge on Tuesday   [name], MD 01/05/2017, 10:21 AMPatient ID: Patricia Hall, female   DOB: 1966-02-27, 51 y.o.   MRN: [id number] Patient ID: Patricia Hall, female   DOB: 1966/01/04, 51 y.o.   MRN: [id number]

## 2017-01-05 NOTE — Progress Notes (Signed)
Patient has been laughing, joking with peers.  Has played cards, watched tv, attended groups.   Respirations even and unlabored.  No signs/symptoms of pain/distress noted on patient's face/body movements. Safety maintained with 15 minute checks.

## 2017-01-05 NOTE — BHH Suicide Risk Assessment (Signed)
[place] INPATIENT:  Family/Significant Other Suicide Prevention Education  Suicide Prevention Education:  Education Completed; Sponsor, Mikey Bussing [phone] has been identified by the patient as the family member/significant other with whom the patient will be residing, and identified as the person(s) who will aid the patient in the event of a mental health crisis (suicidal ideations/suicide attempt).  With written consent from the patient, the family member/significant other has been provided the following suicide prevention education, prior to the and/or following the discharge of the patient.  The suicide prevention education provided includes the following:  Suicide risk factors  Suicide prevention and interventions  National Suicide Hotline telephone number  [facility] assessment telephone number  [facility] Emergency Assistance [place] and/or Residential Mobile Crisis Unit telephone number  Request made of family/significant other to:  Remove weapons (e.g., guns, rifles, knives), all items previously/currently identified as safety concern.    Remove drugs/medications (over-the-counter, prescriptions, illicit drugs), all items previously/currently identified as a safety concern.  The family member/significant other verbalizes understanding of the suicide prevention education information provided.  The family member/significant other agrees to remove the items of safety concern listed above.  Sponsor had a son who committed suicide, learned from it.  He was able to relay some of the things he saw happening in pt, such as isolation and feeling "really beat down, not willing to take care of herself."  Maretta Los 01/05/2017, 9:40 AM

## 2017-01-05 NOTE — Plan of Care (Signed)
Problem: Coping: Goal: Ability to cope will improve Outcome: Progressing Nurse discussed suicidal thoughts/depression/coping skills with patient.

## 2017-01-06 MED ORDER — TRAZODONE HCL 50 MG PO TABS: 50.0000 mg | ORAL_TABLET | Freq: Every evening | ORAL | 0 refills | Status: DC | PRN

## 2017-01-06 MED ORDER — GABAPENTIN 300 MG PO CAPS: 300.0000 mg | ORAL_CAPSULE | Freq: Three times a day (TID) | ORAL | 0 refills | Status: DC

## 2017-01-06 MED ORDER — NICOTINE POLACRILEX 2 MG MT GUM: 2.0000 mg | CHEWING_GUM | OROMUCOSAL | 0 refills | Status: DC | PRN

## 2017-01-06 MED ORDER — OLANZAPINE 10 MG PO TABS: 10.0000 mg | ORAL_TABLET | Freq: Every day | ORAL | 0 refills | Status: DC

## 2017-01-06 MED ORDER — HYDROXYZINE HCL 25 MG PO TABS: 25.0000 mg | ORAL_TABLET | Freq: Three times a day (TID) | ORAL | 0 refills | Status: DC | PRN

## 2017-01-06 MED ORDER — HYDROXYZINE HCL 50 MG PO TABS
50.0000 mg | ORAL_TABLET | Freq: Three times a day (TID) | ORAL | Status: DC | PRN
  Administered 2017-01-06: 50 mg via ORAL
  Filled 2017-01-06: qty 1

## 2017-01-06 NOTE — Progress Notes (Signed)
Patient ID: Patricia Hall, female   DOB: Sep 16, 1965, 51 y.o.   MRN: [id number] PER STATE REGULATIONS 482.30  THIS CHART WAS REVIEWED FOR MEDICAL NECESSITY WITH RESPECT TO THE PATIENT'S ADMISSION/ DURATION OF STAY.  NEXT REVIEW DATE: 01/10/2017  Chauncy Lean, RN, BSN CASE MANAGER

## 2017-01-06 NOTE — Progress Notes (Signed)
Nursing Progress Note: 7p-7a D: Pt currently presents with a agitated/ irratable affect and behavior. Pt states "I know these people are talking to me. I know it. I am on my last nerve, but I have just been ignoring it though." Interacting appropriately with milieu. Pt reports good sleep with current medication regimen.   A: Pt provided with medications per providers orders. Pt's labs and vitals were monitored throughout the night. Pt supported emotionally and encouraged to express concerns and questions. Pt educated on medications.  R: Pt's safety ensured with 15 minute and environmental checks. Pt currently denies SI/HI/Self Harm and AVH. Pt verbally contracts to seek staff if SI/HI or A/VH occurs and to consult with staff before acting on any harmful thoughts. Will continue to monitor.

## 2017-01-06 NOTE — Progress Notes (Signed)
  [facility] Adult Case Management Discharge Plan :  Will you be returning to the same living situation after discharge:  No.Pt has screening at [facility] at discharge.  At discharge, do you have transportation home?: Yes,  taxi voucher in chart for Tuesday morning discharge at [place] you have the ability to pay for your medications: Yes,  medicaid/medicare  Release of information consent forms completed and submitted to medical records by CSW.  Patient to Follow up at: Follow-up Information    [facility].   Specialty:  Family Medicine Why:  Please call office prior to discharge from [facility] for outpatient follow-up. Thank you.  Contact information: [address] [place] [address] [phone]        [facility], [facility] Follow up on 01/07/2017.   Why:  Screening for possible admission on Tuesday at 745am. Please bring: Photo ID and Medicaid card if you have it. Please bring 2 week supply of medications/prescriptions provided to you by the hospital. Thank you.  Contact information: [address] [place] [address] [phone]           Next level of care provider has access to [facility] and Suicide Prevention discussed: Yes,  SPE completed with pt's sponsor.   Have you used any form of tobacco in the last 30 days? (Cigarettes, Smokeless Tobacco, Cigars, and/or Pipes): Yes  Has patient been referred to the Quitline?: Patient refused referral  Patient has been referred for addiction treatment: Yes  Porchea Charrier N Smart LCSW 01/06/2017, 11:29 AM

## 2017-01-06 NOTE — BHH Suicide Risk Assessment (Signed)
[facility] Discharge Suicide Risk Assessment   Principal Problem: Substance induced mood disorder [facility]) Discharge Diagnoses:  Patient Active Problem List   Diagnosis Date Noted  . Substance induced mood disorder ([place]) [F19.94] 01/02/2017  . Bipolar disorder, current episode depressed, severe, without psychotic features ([place]) [F31.4] 02/04/2015  . Bipolar affective disorder, depressed ([place]) [F31.30] 02/04/2015  . Depression [F32.9]   . Suicidal ideation [R45.851]   . Paresthesia [R20.2] 09/01/2014  . Chronic daily headache [R51] 09/01/2014  . Acute cholecystitis [K81.0] 05/12/2012    Total Time spent with patient: 45 minutes  Musculoskeletal: Strength & Muscle Tone: within normal limits Gait & Station: normal Patient leans: N/A  Psychiatric Specialty Exam: Review of Systems  Constitutional: Negative.   HENT: Negative.   Eyes: Negative.   Respiratory: Negative.   Cardiovascular: Negative.   Gastrointestinal: Negative.   Genitourinary: Negative.   Musculoskeletal: Negative.   Skin: Negative.   Neurological: Negative.   Endo/Heme/Allergies: Negative.   Psychiatric/Behavioral: Negative for depression, hallucinations, memory loss, substance abuse and suicidal ideas. The patient is not nervous/anxious and does not have insomnia.     Blood pressure 115/63, pulse (!) 47, temperature 98.3 F (36.8 C), temperature source Oral, resp. rate 16, height 5' 1.5" (1.562 m), weight 86.2 kg (190 lb), SpO2 100 %.Body mass index is 35.32 kg/m.  General Appearance: Neatly dressed, pleasant, engaging well and cooperative. Appropriate behavior. Not in any distress. Good relatedness. Not internally stimulated  Eye Contact::  Good  Speech: Spontaneous, normal prosody. Normal tone and rate.   Volume:  Normal  Mood:  Euthymic  Affect:  Appropriate and Full Range  Thought Process:  Goal Directed  Orientation:  Full (Time, Place, and Person)  Thought Content:  No delusional theme. No preoccupation with  violent thoughts. No negative ruminations. No obsession.  No hallucination in any modality.   Suicidal Thoughts:  No  Homicidal Thoughts:  No  Memory:  Immediate;   Good Recent;   Good Remote;   Good  Judgement:  Good  Insight:  Good  Psychomotor Activity:  Normal  Concentration:  Good  Recall:  Good  Fund of Knowledge:Good  Language: Good  Akathisia:  Negative  Handed:    AIMS (if indicated):     Assets:  Communication Skills Desire for Improvement Physical Health Resilience Social Support  Sleep:  Number of Hours: 5  Cognition: WNL  ADL's:  Intact   Clinical  Assessment::    51 yo Caucasian female, single homeless, unemployed. Background history of SUD and Mood disorder. Self presented to the ER. Expressed thoughts of suicide. No specific intent. Reports disgust with self for relapsing on substances. UDS is positive for cocaine and THC. Other parameters are within normal limit.  Seen today. In good spirits. Pleased she would be going into residential care. Hopes to stay sober when she gets out of rehab. Reports feeling good.  Not feeling depressed. Reports normal energy and interest. Has been maintaining normal biological functions. She is able to think clearly. She is able to focus on task. Her thoughts are not crowded or racing. No evidence of mania. No hallucination in any modality. She is not making any delusional statement. No passivity of will/thought. She is fully in touch with reality. No thoughts of suicide. No thoughts of homicide. No violent thoughts. No overwhelming anxiety.  Nursing staff reports that patient has been appropriate on the unit. Patient has been interacting well with peers. No behavioral issues. Patient has not voiced any suicidal thoughts. Patient has  not been observed to be internally stimulated. Patient has been adherent with treatment recommendations. Patient has been tolerating their medication well.   Patient was discussed at team. Team members  feels that patient is back to her baseline level of function. Team agrees with plan to discharge patient today.  Demographic Factors:  Low socioeconomic status  Loss Factors: NA  Historical Factors: Impulsivity  Risk Reduction Factors:   Living with another person, especially a relative, Positive social support, Positive therapeutic relationship and Positive coping skills or problem solving skills  Continued Clinical Symptoms:   as above  Cognitive Features That Contribute To Risk:  None    Suicide Risk:  Minimal: No identifiable suicidal ideation.  Patient is not having any thoughts of suicide at this time. Modifiable risk factors targeted during this admission includes depression and substance use. Demographical and historical risk factors cannot be modified. Patient is now engaging well. Patient is reliable and is future oriented. We have buffered patient's support structures. At this point, patient is at low risk of suicide. Patient is aware of the effects of psychoactive substances on decision making process. Patient has been provided with emergency contacts. Patient acknowledges to use resources provided if unforseen circumstances changes their current risk stratification.   Follow-up Information    [facility].   Specialty:  Family Medicine Why:  Please call office prior to discharge from [facility] residential for outpatient follow-up. Thank you.  Contact information: [address] [place] [address] [phone]        [facility] Follow up on 01/07/2017.   Why:  Screening for possible admission on Tuesday at 745am. Please bring: Photo ID and Medicaid card if you have it. Please bring 2 week supply of medications/prescriptions provided to you by the hospital. Thank you.  Contact information: [place] [address] [phone]           Plan Of Care/Follow-up recommendations:   1. Continue current psychotropic medications 2. Mental health and addiction follow up as arranged.  3. Provided limited quantity of prescriptions   [name], MD 01/06/2017, 3:03 PM

## 2017-01-06 NOTE — Progress Notes (Signed)
Recreation Therapy Notes  Date: 01/06/17 Time: 0930 Location: [address] Dayroom  Group Topic: Stress Management  Goal Area(s) Addresses:  Patient will verbalize importance of using healthy stress management.  Patient will identify positive emotions associated with healthy stress management.   Behavioral Response: Engaged  Intervention: Stress Management  Activity :  Peaceful [place]aves.  LRT introduced the stress management technique of guided imagery.  LRT read a script to allow patients the opportunity for take a mental journey to the beach.  Patients were to follow along as LRT read script to engage in the technique.  Education:  Stress Management, Discharge Planning.   Education Outcome: Acknowledges edcuation/In group clarification offered/Needs additional education  Clinical Observations/Feedback: Pt attended group.   Ritamarie Arkin Ria Comment, LRT/CTRS         Ria Comment, Marcellius Montagna A 01/06/2017 12:15 PM

## 2017-01-06 NOTE — Progress Notes (Addendum)
.  Patient in hallway, plans to go to recreation outside.

## 2017-01-06 NOTE — BHH Group Notes (Addendum)
[place] LCSW Group Therapy  01/06/2017 1:04 PM  Type of Therapy:  Group Therapy  Participation Level:  Attended  Pt was attentive and engaged. She is looking forward to [facility] and possible admission tomorrow. Pt asked about her memory "Do you think I have memory loss that will go away because of my drug use or do I have Alzheimers.?" patient encouraged to discuss these concerns with her psychiatrist and outpatient provider.    Summary of Progress/Problems: Today's Topic: Overcoming Obstacles. Patients identified one short term goal and potential obstacles in reaching this goal. Patients processed barriers involved in overcoming these obstacles. Patients identified steps necessary for overcoming these obstacles and explored motivation (internal and external) for facing these difficulties head on.   Tyrik Stetzer N Smart LCSW 01/06/2017, 1:04 PM
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
## 2017-01-06 NOTE — Progress Notes (Addendum)
Patient went to dining room for lunch.  Resting in her room after lunch.

## 2017-01-06 NOTE — Tx Team (Signed)
Interdisciplinary Treatment and Diagnostic Plan Update  01/06/2017 Time of Session: 0930 Patricia Hall MRN: [id number]  Principal Diagnosis: Substance induced mood disorder ([place])  Secondary Diagnoses: Principal Problem:   Substance induced mood disorder ([place]) Active Problems:   Suicidal ideation   Current Medications:  Current Facility-Administered Medications  Medication Dose Route Frequency Provider Last Rate Last Dose  . acetaminophen (TYLENOL) tablet 650 mg  650 mg Oral Q6H PRN Laverle Hobby, PA-C   650 mg at 01/06/17 0820  . alum & mag hydroxide-simeth (MAALOX/MYLANTA) 200-200-20 MG/5ML suspension 30 mL  30 mL Oral Q4H PRN [name], PA-C   30 mL at 01/05/17 2245  . gabapentin (NEURONTIN) capsule 300 mg  300 mg Oral TID [name], MD   300 mg at 01/06/17 0817  . Glycerin (Adult) 2.1 g suppository 1 suppository  1 suppository Rectal Daily PRN [name], MD   1 suppository at 01/03/17 1149  . hydrOXYzine (ATARAX/VISTARIL) tablet 25 mg  25 mg Oral TID PRN [name], PA-C   25 mg at 01/06/17 5170  . magnesium hydroxide (MILK OF MAGNESIA) suspension 30 mL  30 mL Oral Daily PRN Laverle Hobby, PA-C   30 mL at 01/03/17 0174  . nicotine polacrilex (NICORETTE) gum 2 mg  2 mg Oral PRN [name], [name], MD   2 mg at 01/06/17 0641  . OLANZapine (ZYPREXA) tablet 10 mg  10 mg Oral QHS Izediuno, [name], MD   10 mg at 01/05/17 2123  . rosuvastatin (CRESTOR) tablet 10 mg  10 mg Oral QHS Laverle Hobby, PA-C   10 mg at 01/05/17 2124  . traZODone (DESYREL) tablet 50 mg  50 mg Oral QHS,MR X 1 Laverle Hobby, PA-C   50 mg at 01/05/17 2245   PTA Medications: Prescriptions Prior to Admission  Medication Sig Dispense Refill Last Dose  . cholecalciferol 1000 UNITS tablet Take 1 tablet (1,000 Units total) by mouth daily. (Patient not taking: Reported on 01/01/2017) 30 tablet 0 Not Taking at Unknown time  . lamoTRIgine (LAMICTAL) 25 MG tablet Take 1 tablet (25  mg total) by mouth daily. (Patient not taking: Reported on 01/01/2017) 30 tablet 0 Not Taking at Unknown time  . lurasidone 60 MG TABS Take 60 mg by mouth at bedtime. (Patient not taking: Reported on 01/01/2017) 30 tablet 0 Not Taking at Unknown time  . nicotine (NICODERM CQ - DOSED IN MG/24 HOURS) 21 mg/24hr patch Place 1 patch (21 mg total) onto the skin daily. (Patient not taking: Reported on 01/01/2017) 28 patch 0 Not Taking at Unknown time  . pregabalin (LYRICA) 75 MG capsule Take 1 capsule (75 mg total) by mouth 2 (two) times daily. (Patient not taking: Reported on 01/01/2017) 60 capsule 0 Not Taking at Unknown time  . rosuvastatin (CRESTOR) 10 MG tablet Take 1 tablet (10 mg total) by mouth at bedtime. (Patient not taking: Reported on 01/01/2017) 30 tablet 0 Not Taking at Unknown time  . traZODone (DESYREL) 50 MG tablet Take 0.5 tablets (25 mg total) by mouth at bedtime as needed for sleep. (Patient not taking: Reported on 01/01/2017) 30 tablet 0 Not Taking at Unknown time    Patient Stressors: Loss of housing Medication change or noncompliance Substance abuse Other: Volatile Situation with Past Roomates  Patient Strengths: Ability for insight Active sense of humor Average or above average intelligence Communication skills General fund of knowledge Motivation for treatment/growth Supportive family/friends  Treatment Modalities: Medication Management, Group therapy, Case  management,  1 to 1 session with clinician, Psychoeducation, Recreational therapy.   Physician Treatment Plan for Primary Diagnosis: Substance induced mood disorder ([place]) Long Term Goal(s): Improvement in symptoms so as ready for discharge Improvement in symptoms so as ready for discharge   Short Term Goals: Ability to identify changes in lifestyle to reduce recurrence of condition will improve Ability to demonstrate self-control will improve Compliance with prescribed medications will improve Ability to identify  triggers associated with substance abuse/mental health issues will improve Ability to disclose and discuss suicidal ideas Ability to identify and develop effective coping behaviors will improve  Medication Management: Evaluate patient's response, side effects, and tolerance of medication regimen.  Therapeutic Interventions: 1 to 1 sessions, Unit Group sessions and Medication administration.  Evaluation of Outcomes: Adequate for discharge   Physician Treatment Plan for Secondary Diagnosis: Principal Problem:   Substance induced mood disorder ([place]) Active Problems:   Suicidal ideation  Long Term Goal(s): Improvement in symptoms so as ready for discharge Improvement in symptoms so as ready for discharge   Short Term Goals: Ability to identify changes in lifestyle to reduce recurrence of condition will improve Ability to demonstrate self-control will improve Compliance with prescribed medications will improve Ability to identify triggers associated with substance abuse/mental health issues will improve Ability to disclose and discuss suicidal ideas Ability to identify and develop effective coping behaviors will improve     Medication Management: Evaluate patient's response, side effects, and tolerance of medication regimen.  Therapeutic Interventions: 1 to 1 sessions, Unit Group sessions and Medication administration.  Evaluation of Outcomes: Adequate for discharge    RN Treatment Plan for Primary Diagnosis: Substance induced mood disorder ([place]) Long Term Goal(s): Knowledge of disease and therapeutic regimen to maintain health will improve  Short Term Goals: Ability to remain free from injury will improve, Ability to disclose and discuss suicidal ideas and Ability to identify and develop effective coping behaviors will improve  Medication Management: RN will administer medications as ordered by provider, will assess and evaluate patient's response and provide education to patient for  prescribed medication. RN will report any adverse and/or side effects to prescribing provider.  Therapeutic Interventions: 1 on 1 counseling sessions, Psychoeducation, Medication administration, Evaluate responses to treatment, Monitor vital signs and CBGs as ordered, Perform/monitor CIWA, COWS, AIMS and Fall Risk screenings as ordered, Perform wound care treatments as ordered.  Evaluation of Outcomes: Adequate for discharge    LCSW Treatment Plan for Primary Diagnosis: Substance induced mood disorder ([place]) Long Term Goal(s): Safe transition to appropriate next level of care at discharge, Engage patient in therapeutic group addressing interpersonal concerns.  Short Term Goals: Engage patient in aftercare planning with referrals and resources, Facilitate patient progression through stages of change regarding substance use diagnoses and concerns and Identify triggers associated with mental health/substance abuse issues  Therapeutic Interventions: Assess for all discharge needs, 1 to 1 time with Social worker, Explore available resources and support systems, Assess for adequacy in community support network, Educate family and significant other(s) on suicide prevention, Complete Psychosocial Assessment, Interpersonal group therapy.  Evaluation of Outcomes: Met   Progress in Treatment: Attending groups: Yes Participating in groups: Yes Taking medication as prescribed: Yes. Toleration medication: Yes. Family/Significant other contact made: SPE completed with pt's sponsor.  Patient understands diagnosis: Yes. Discussing patient identified problems/goals with staff: Yes. Medical problems stabilized or resolved: Yes. Denies suicidal/homicidal ideation: Yes Issues/concerns per patient self-inventory: No. Other: n/a   New problem(s) identified: No, Describe:  n/a  New  Short Term/Long Term Goal(s): detox, medication stabilization, elimination of SI thoughts, development of comprehensive mental  wellness/sobriety plan.   Discharge Plan or Barriers: [facility] on Tuesday 01/07/17 at 745am. Pt will [facility] from [facility] for outpatient services. Pt given AA/NA list for [employer] pamphlet afor additional community resources.   Reason for Continuation of Hospitalization: medication management   Estimated Length of Stay: 1 day--discharge scheduled for 01/07/17 at [place] via taxi   Attendees: Patient: 01/06/2017 8:42 AM  Physician: Dr. Sanjuana Letters MD 01/06/2017 8:42 AM  Nursing: Ansel Bong RN 01/06/2017 8:42 AM  RN Care Manager: Lars Pinks CM 01/06/2017 8:42 AM  Social Worker: Maxie Better, LCSW 01/06/2017 8:42 AM  Recreational Therapist: x 01/06/2017 8:42 AM  Other: Ricky Ala NP 01/06/2017 8:42 AM  Other:  01/06/2017 8:42 AM  Other: 01/06/2017 8:42 AM    Scribe for Treatment Team: Winter, LCSW 01/06/2017 8:42 AM

## 2017-01-06 NOTE — Progress Notes (Signed)
D:  Patient's self inventory sheet, patient sleeps good, sleep medication helpful.  Good appetite, hyper energy level, good concentration.  Rated depression and hopeless #3, anxiety 8.  Denied withdrawals, checked, chilling, cramping, cravings, nausea, runny nose, irritability, comes and goes.  Denied SI.  Physical problems, lightheaded, pain, dizziness, headaches, blurred vision.  Physical pain, legs, worst pain in past 24 hours is #8.  Pain medication not helpful.  Goal is being stable and discharge to [facility].  Plans to focus.  "Thanks for helping me.  Thank God I like all the staff.  Sorry if I get on your nerves."  Does have discharge plans. A:  Medications administered per MD orders.  Emotional support and encouragement. R:  Denied SI and HI, contracts for safety.  Denied A/V hallucinations.  Safety maintained with 15 minute checks.

## 2017-01-07 NOTE — Progress Notes (Signed)
Pt discharged from unit after AVS, transition report, prescriptions, and suicide risk assessment were reviewed. Pt confirmed information with teach back. Pt denies SI/HI/AVH and contracts to seek support if thoughts as such occur. Patient was offered support and encouragement. All belongings returned to patient. Pts questions were answered and was escorted safely to transportation.   

## 2017-01-30 DIAGNOSIS — B1920 Unspecified viral hepatitis C without hepatic coma: Secondary | ICD-10-CM | POA: Diagnosis not present

## 2017-01-30 DIAGNOSIS — F3130 Bipolar disorder, current episode depressed, mild or moderate severity, unspecified: Secondary | ICD-10-CM | POA: Diagnosis not present

## 2017-01-30 DIAGNOSIS — E669 Obesity, unspecified: Secondary | ICD-10-CM | POA: Diagnosis not present

## 2017-01-30 DIAGNOSIS — Z124 Encounter for screening for malignant neoplasm of cervix: Secondary | ICD-10-CM | POA: Diagnosis not present

## 2017-01-30 DIAGNOSIS — Z131 Encounter for screening for diabetes mellitus: Secondary | ICD-10-CM | POA: Diagnosis not present

## 2017-01-30 DIAGNOSIS — E559 Vitamin D deficiency, unspecified: Secondary | ICD-10-CM | POA: Diagnosis not present

## 2017-01-30 DIAGNOSIS — G629 Polyneuropathy, unspecified: Secondary | ICD-10-CM | POA: Diagnosis not present

## 2017-01-30 DIAGNOSIS — E785 Hyperlipidemia, unspecified: Secondary | ICD-10-CM | POA: Diagnosis not present

## 2017-01-30 DIAGNOSIS — N390 Urinary tract infection, site not specified: Secondary | ICD-10-CM | POA: Diagnosis not present

## 2017-01-30 DIAGNOSIS — F419 Anxiety disorder, unspecified: Secondary | ICD-10-CM | POA: Diagnosis not present

## 2017-02-20 DIAGNOSIS — B1920 Unspecified viral hepatitis C without hepatic coma: Secondary | ICD-10-CM | POA: Diagnosis not present

## 2017-02-20 DIAGNOSIS — G629 Polyneuropathy, unspecified: Secondary | ICD-10-CM | POA: Diagnosis not present

## 2017-02-20 DIAGNOSIS — E559 Vitamin D deficiency, unspecified: Secondary | ICD-10-CM | POA: Diagnosis not present

## 2017-02-20 DIAGNOSIS — F3130 Bipolar disorder, current episode depressed, mild or moderate severity, unspecified: Secondary | ICD-10-CM | POA: Diagnosis not present

## 2017-02-20 DIAGNOSIS — E785 Hyperlipidemia, unspecified: Secondary | ICD-10-CM | POA: Diagnosis not present

## 2017-02-20 DIAGNOSIS — E669 Obesity, unspecified: Secondary | ICD-10-CM | POA: Diagnosis not present

## 2017-02-20 DIAGNOSIS — F419 Anxiety disorder, unspecified: Secondary | ICD-10-CM | POA: Diagnosis not present

## 2017-02-20 DIAGNOSIS — Z720 Tobacco use: Secondary | ICD-10-CM | POA: Diagnosis not present

## 2017-02-21 DIAGNOSIS — Z1231 Encounter for screening mammogram for malignant neoplasm of breast: Secondary | ICD-10-CM | POA: Diagnosis not present

## 2017-02-28 DIAGNOSIS — R922 Inconclusive mammogram: Secondary | ICD-10-CM | POA: Diagnosis not present

## 2017-03-05 DIAGNOSIS — J45909 Unspecified asthma, uncomplicated: Secondary | ICD-10-CM | POA: Diagnosis not present

## 2017-03-05 DIAGNOSIS — I739 Peripheral vascular disease, unspecified: Secondary | ICD-10-CM | POA: Diagnosis not present

## 2017-03-05 DIAGNOSIS — Z720 Tobacco use: Secondary | ICD-10-CM | POA: Diagnosis not present

## 2017-03-05 DIAGNOSIS — E785 Hyperlipidemia, unspecified: Secondary | ICD-10-CM | POA: Diagnosis not present

## 2017-03-05 DIAGNOSIS — R062 Wheezing: Secondary | ICD-10-CM | POA: Diagnosis not present

## 2017-05-21 DIAGNOSIS — E785 Hyperlipidemia, unspecified: Secondary | ICD-10-CM | POA: Diagnosis not present

## 2017-05-21 DIAGNOSIS — F419 Anxiety disorder, unspecified: Secondary | ICD-10-CM | POA: Diagnosis not present

## 2017-05-21 DIAGNOSIS — R05 Cough: Secondary | ICD-10-CM | POA: Diagnosis not present

## 2017-05-21 DIAGNOSIS — E669 Obesity, unspecified: Secondary | ICD-10-CM | POA: Diagnosis not present

## 2017-05-21 DIAGNOSIS — E559 Vitamin D deficiency, unspecified: Secondary | ICD-10-CM | POA: Diagnosis not present

## 2017-05-21 DIAGNOSIS — B1920 Unspecified viral hepatitis C without hepatic coma: Secondary | ICD-10-CM | POA: Diagnosis not present

## 2017-05-21 DIAGNOSIS — Z720 Tobacco use: Secondary | ICD-10-CM | POA: Diagnosis not present

## 2017-05-21 DIAGNOSIS — G629 Polyneuropathy, unspecified: Secondary | ICD-10-CM | POA: Diagnosis not present

## 2017-06-02 DIAGNOSIS — E669 Obesity, unspecified: Secondary | ICD-10-CM | POA: Diagnosis not present

## 2017-06-02 DIAGNOSIS — B1920 Unspecified viral hepatitis C without hepatic coma: Secondary | ICD-10-CM | POA: Diagnosis not present

## 2017-06-02 DIAGNOSIS — N898 Other specified noninflammatory disorders of vagina: Secondary | ICD-10-CM | POA: Diagnosis not present

## 2017-06-02 DIAGNOSIS — G629 Polyneuropathy, unspecified: Secondary | ICD-10-CM | POA: Diagnosis not present

## 2017-06-02 DIAGNOSIS — F419 Anxiety disorder, unspecified: Secondary | ICD-10-CM | POA: Diagnosis not present

## 2017-06-02 DIAGNOSIS — E559 Vitamin D deficiency, unspecified: Secondary | ICD-10-CM | POA: Diagnosis not present

## 2017-06-02 DIAGNOSIS — Z720 Tobacco use: Secondary | ICD-10-CM | POA: Diagnosis not present

## 2017-06-02 DIAGNOSIS — E785 Hyperlipidemia, unspecified: Secondary | ICD-10-CM | POA: Diagnosis not present

## 2017-06-02 DIAGNOSIS — R05 Cough: Secondary | ICD-10-CM | POA: Diagnosis not present

## 2017-08-16 ENCOUNTER — Emergency Department (HOSPITAL_COMMUNITY)
Admission: EM | Admit: 2017-08-16 | Discharge: 2017-08-16 | Disposition: A | Discharge status: Home / Self Care | Payer: Medicare Other | Attending: Emergency Medicine | Admitting: Emergency Medicine

## 2017-08-16 ENCOUNTER — Encounter (HOSPITAL_COMMUNITY): Payer: Self-pay | Admitting: Nurse Practitioner

## 2017-08-16 DIAGNOSIS — Z79899 Other long term (current) drug therapy: Secondary | ICD-10-CM | POA: Insufficient documentation

## 2017-08-16 DIAGNOSIS — R52 Pain, unspecified: Secondary | ICD-10-CM | POA: Diagnosis not present

## 2017-08-16 DIAGNOSIS — H6692 Otitis media, unspecified, left ear: Secondary | ICD-10-CM | POA: Insufficient documentation

## 2017-08-16 DIAGNOSIS — H9202 Otalgia, left ear: Secondary | ICD-10-CM | POA: Diagnosis present

## 2017-08-16 DIAGNOSIS — R05 Cough: Secondary | ICD-10-CM | POA: Diagnosis not present

## 2017-08-16 DIAGNOSIS — F17210 Nicotine dependence, cigarettes, uncomplicated: Secondary | ICD-10-CM | POA: Diagnosis not present

## 2017-08-16 DIAGNOSIS — J40 Bronchitis, not specified as acute or chronic: Secondary | ICD-10-CM | POA: Diagnosis not present

## 2017-08-16 DIAGNOSIS — R0602 Shortness of breath: Secondary | ICD-10-CM | POA: Diagnosis not present

## 2017-08-16 DIAGNOSIS — H9209 Otalgia, unspecified ear: Secondary | ICD-10-CM | POA: Diagnosis not present

## 2017-08-16 MED ORDER — PREDNISONE 20 MG PO TABS
60.0000 mg | ORAL_TABLET | Freq: Once | ORAL | Status: AC
  Administered 2017-08-16: 60 mg via ORAL
  Filled 2017-08-16: qty 3

## 2017-08-16 MED ORDER — PREDNISONE 20 MG PO TABS: 40.0000 mg | ORAL_TABLET | Freq: Every day | ORAL | 0 refills | Status: DC

## 2017-08-16 MED ORDER — AMOXICILLIN 500 MG PO CAPS: 500.0000 mg | ORAL_CAPSULE | Freq: Two times a day (BID) | ORAL | 0 refills | Status: DC

## 2017-08-16 MED ORDER — IPRATROPIUM-ALBUTEROL 0.5-2.5 (3) MG/3ML IN SOLN
3.0000 mL | Freq: Once | RESPIRATORY_TRACT | Status: AC
Start: 2017-08-16 — End: 2017-08-16
  Administered 2017-08-16: 3 mL via RESPIRATORY_TRACT
  Filled 2017-08-16: qty 3

## 2017-08-16 MED ORDER — AMOXICILLIN 500 MG PO CAPS
500.0000 mg | ORAL_CAPSULE | Freq: Once | ORAL | Status: AC
  Administered 2017-08-16: 500 mg via ORAL
  Filled 2017-08-16: qty 1

## 2017-08-16 MED ORDER — HYDROCODONE-ACETAMINOPHEN 5-325 MG PO TABS
1.0000 | ORAL_TABLET | Freq: Once | ORAL | Status: AC
  Administered 2017-08-16: 1 via ORAL
  Filled 2017-08-16: qty 1

## 2017-08-16 MED ORDER — ALBUTEROL SULFATE HFA 108 (90 BASE) MCG/ACT IN AERS
1.0000 | INHALATION_SPRAY | Freq: Once | RESPIRATORY_TRACT | Status: AC
  Administered 2017-08-16: 1 via RESPIRATORY_TRACT
  Filled 2017-08-16: qty 6.7

## 2017-08-16 NOTE — Discharge Instructions (Signed)
Take Amoxicillin twice a day for the next week Take Prednisone for the next 5 days Use inhaler as needed for shortness of breath Return if worsening

## 2017-08-16 NOTE — ED Provider Notes (Signed)
[facility] DEPT Provider Note   CSN: [id number] Arrival date & time: 08/16/17  1732   History   Chief Complaint Chief Complaint  Patient presents with  . Otalgia    Left    HPI Patricia Hall is a 52 y.o. female who presents with left ear pain. PMH significant for bipolar d/o, substance abuse, tobacco use, Hept C. She states she's had a "chest cold" for several days. She's had coughing, SOB, and wheezing. This morning she woke up with acute left sided ear pain. A roommate thought she had a roach in her ear and tried to pull it out with tweezers. She has now started to have ear pain on the right side. She denies fevers, chills, chest pain, N/V. No known sick contacts.  HPI  Past Medical History:  Diagnosis Date  . Bipolar 1 disorder ([place])   . Cholecystitis 05/12/2012  . Chronic daily headache 09/01/2014  . Hep C w/ coma, chronic ([place])   . Hyperlipemia   . Obese   . Paresthesia 09/01/2014  . Substance abuse ([place])   . Substance abuse ([place])    cocaine  . Vitamin D deficiency     Patient Active Problem List   Diagnosis Date Noted  . Substance induced mood disorder ([place]) 01/02/2017  . Bipolar disorder, current episode depressed, severe, without psychotic features ([place]) 02/04/2015  . Bipolar affective disorder, depressed ([place]) 02/04/2015  . Depression   . Suicidal ideation   . Paresthesia 09/01/2014  . Chronic daily headache 09/01/2014  . Acute cholecystitis 05/12/2012    Past Surgical History:  Procedure Laterality Date  . APPENDECTOMY    . CHOLECYSTECTOMY  05/12/2012   Procedure: LAPAROSCOPIC CHOLECYSTECTOMY WITH INTRAOPERATIVE CHOLANGIOGRAM;  Surgeon: Ralene Ok, MD;  Location: [facility];  Service: General;  Laterality: N/A;  . EYE SURGERY     strabismus surgery    OB History    No data available       Home Medications    Prior to Admission medications   Medication Sig Start Date End Date Taking? Authorizing Provider    gabapentin (NEURONTIN) 300 MG capsule Take 1 capsule (300 mg total) by mouth 3 (three) times daily. 01/06/17   Derrill Center, NP  hydrOXYzine (ATARAX/VISTARIL) 25 MG tablet Take 1 tablet (25 mg total) by mouth 3 (three) times daily as needed for anxiety. 01/06/17   Derrill Center, NP  nicotine polacrilex (NICORETTE) 2 MG gum Take 1 each (2 mg total) by mouth as needed for smoking cessation. 01/06/17   Derrill Center, NP  OLANZapine (ZYPREXA) 10 MG tablet Take 1 tablet (10 mg total) by mouth at bedtime. 01/06/17   Derrill Center, NP  rosuvastatin (CRESTOR) 10 MG tablet Take 1 tablet (10 mg total) by mouth at bedtime. Patient not taking: Reported on 01/01/2017 02/09/15   Kerrie Buffalo, NP  traZODone (DESYREL) 50 MG tablet Take 1 tablet (50 mg total) by mouth at bedtime and may repeat dose one time if needed. 01/06/17   Derrill Center, NP    Family History Family History  Problem Relation Age of Onset  . Heart disease Mother   . Diabetes Mother   . Diabetes Father   . Heart disease Father   . Diabetes Sister   . Diabetes Brother     Social History Social History   Tobacco Use  . Smoking status: Current Every Day Smoker    Packs/day: 1.00    Years: 25.00    Pack  years: 25.00    Types: Cigarettes  . Smokeless tobacco: Never Used  Substance Use Topics  . Alcohol use: Yes    Comment: occasional  . Drug use: Yes    Types: "Crack" cocaine, Marijuana    Comment: daily cocaine use. as much as I can get.      Allergies   Patient has no known allergies.   Review of Systems Review of Systems  Constitutional: Negative for chills and fever.  HENT: Positive for congestion and ear pain. Negative for rhinorrhea.   Respiratory: Positive for cough, shortness of breath and wheezing.   Cardiovascular: Negative for chest pain.     Physical Exam Updated Vital Signs BP 106/67 (BP Location: Left Arm)   Pulse 78   Temp 97.9 F (36.6 C) (Oral)   SpO2 96%   Physical Exam   Constitutional: She is oriented to person, place, and time. She appears well-developed and well-nourished. No distress.  HENT:  Head: Normocephalic and atraumatic.  Right Ear: Hearing, external ear and ear canal normal. Tympanic membrane is erythematous.  Left Ear: Hearing, external ear and ear canal normal. Tympanic membrane is injected.  Nose: Nose normal.  Mouth/Throat: Uvula is midline, oropharynx is clear and moist and mucous membranes are normal.  Eyes: Conjunctivae are normal. Pupils are equal, round, and reactive to light. Right eye exhibits no discharge. Left eye exhibits no discharge. No scleral icterus.  Neck: Normal range of motion.  Cardiovascular: Normal rate and regular rhythm.  Pulmonary/Chest: Effort normal. No respiratory distress. She has wheezes (Diffuse expiratory). She has rhonchi (scattered).  Abdominal: She exhibits no distension.  Neurological: She is alert and oriented to person, place, and time.  Skin: Skin is warm and dry.  Psychiatric: She has a normal mood and affect. Her behavior is normal.  Nursing note and vitals reviewed.    ED Treatments / Results  Labs (all labs ordered are listed, but only abnormal results are displayed) Labs Reviewed - No data to display  EKG  EKG Interpretation None       Radiology No results found.  Procedures Procedures (including critical care time)  Medications Ordered in ED Medications  ipratropium-albuterol (DUONEB) 0.5-2.5 (3) MG/3ML nebulizer solution 3 mL (not administered)  HYDROcodone-acetaminophen (NORCO/VICODIN) 5-325 MG per tablet 1 tablet (not administered)  predniSONE (DELTASONE) tablet 60 mg (not administered)  amoxicillin (AMOXIL) capsule 500 mg (not administered)     Initial Impression / Assessment and Plan / ED Course  I have reviewed the triage vital signs and the nursing notes.  Pertinent labs & imaging results that were available during my care of the patient were reviewed by me and  considered in my medical decision making (see chart for details).  52 year old female presents with left ear pain due to otitis media and bronchitis. Vitals are normal. Lung exam is remarkable for wheezes and rhonchi. She was given a breathing tx and feels somewhat better afterwards. She still has mild expiratory wheezes after the treatment. She was given a dose of Prednisone, Amoxil, Norco. She was given an inhaler to go home with. She was given an rx for Prednisone and Amoxil and advised to take Ibuprofen for pain. Return precautions given.  Final Clinical Impressions(s) / ED Diagnoses   Final diagnoses:  Left otitis media, unspecified otitis media type  Bronchitis    ED Discharge Orders    None       Iris Pert 08/16/17 Steward Drone, MD 08/16/17  2012  

## 2017-08-16 NOTE — ED Triage Notes (Signed)
Pt is c/o left ear pain, initially suspected she "might have a roach in it since her house is roach infested."

## 2017-09-09 DIAGNOSIS — E785 Hyperlipidemia, unspecified: Secondary | ICD-10-CM | POA: Diagnosis not present

## 2017-09-09 DIAGNOSIS — Z113 Encounter for screening for infections with a predominantly sexual mode of transmission: Secondary | ICD-10-CM | POA: Diagnosis not present

## 2017-09-09 DIAGNOSIS — Z1389 Encounter for screening for other disorder: Secondary | ICD-10-CM | POA: Diagnosis not present

## 2017-09-09 DIAGNOSIS — E559 Vitamin D deficiency, unspecified: Secondary | ICD-10-CM | POA: Diagnosis not present

## 2017-09-09 DIAGNOSIS — F419 Anxiety disorder, unspecified: Secondary | ICD-10-CM | POA: Diagnosis not present

## 2017-09-09 DIAGNOSIS — G629 Polyneuropathy, unspecified: Secondary | ICD-10-CM | POA: Diagnosis not present

## 2017-09-09 DIAGNOSIS — Z0100 Encounter for examination of eyes and vision without abnormal findings: Secondary | ICD-10-CM | POA: Diagnosis not present

## 2017-09-09 DIAGNOSIS — B1920 Unspecified viral hepatitis C without hepatic coma: Secondary | ICD-10-CM | POA: Diagnosis not present

## 2017-09-09 DIAGNOSIS — E669 Obesity, unspecified: Secondary | ICD-10-CM | POA: Diagnosis not present

## 2017-09-09 DIAGNOSIS — Z0110 Encounter for examination of ears and hearing without abnormal findings: Secondary | ICD-10-CM | POA: Diagnosis not present

## 2017-09-09 DIAGNOSIS — Z720 Tobacco use: Secondary | ICD-10-CM | POA: Diagnosis not present

## 2017-09-09 DIAGNOSIS — Z131 Encounter for screening for diabetes mellitus: Secondary | ICD-10-CM | POA: Diagnosis not present

## 2017-09-09 DIAGNOSIS — R7303 Prediabetes: Secondary | ICD-10-CM | POA: Diagnosis not present

## 2017-10-27 DIAGNOSIS — Z5181 Encounter for therapeutic drug level monitoring: Secondary | ICD-10-CM | POA: Diagnosis not present

## 2017-11-03 DIAGNOSIS — B009 Herpesviral infection, unspecified: Secondary | ICD-10-CM | POA: Diagnosis not present

## 2017-11-03 DIAGNOSIS — E119 Type 2 diabetes mellitus without complications: Secondary | ICD-10-CM | POA: Diagnosis not present

## 2017-11-03 DIAGNOSIS — N762 Acute vulvitis: Secondary | ICD-10-CM | POA: Diagnosis not present

## 2017-11-03 DIAGNOSIS — Z5181 Encounter for therapeutic drug level monitoring: Secondary | ICD-10-CM | POA: Diagnosis not present

## 2017-11-03 DIAGNOSIS — A539 Syphilis, unspecified: Secondary | ICD-10-CM | POA: Diagnosis not present

## 2017-11-03 DIAGNOSIS — K769 Liver disease, unspecified: Secondary | ICD-10-CM | POA: Diagnosis not present

## 2017-11-03 DIAGNOSIS — E039 Hypothyroidism, unspecified: Secondary | ICD-10-CM | POA: Diagnosis not present

## 2017-11-10 DIAGNOSIS — Z5181 Encounter for therapeutic drug level monitoring: Secondary | ICD-10-CM | POA: Diagnosis not present

## 2017-11-15 ENCOUNTER — Other Ambulatory Visit: Payer: Self-pay

## 2017-11-15 ENCOUNTER — Encounter (HOSPITAL_COMMUNITY): Payer: Self-pay

## 2017-11-15 ENCOUNTER — Emergency Department (HOSPITAL_COMMUNITY): Payer: Medicare Other

## 2017-11-15 ENCOUNTER — Emergency Department (HOSPITAL_COMMUNITY)
Admission: EM | Admit: 2017-11-15 | Discharge: 2017-11-16 | Disposition: A | Discharge status: Other Acute Inpatient Hospital | Payer: Medicare Other | Attending: Emergency Medicine | Admitting: Emergency Medicine

## 2017-11-15 DIAGNOSIS — Z0000 Encounter for general adult medical examination without abnormal findings: Secondary | ICD-10-CM | POA: Diagnosis not present

## 2017-11-15 DIAGNOSIS — Z9119 Patient's noncompliance with other medical treatment and regimen: Secondary | ICD-10-CM | POA: Insufficient documentation

## 2017-11-15 DIAGNOSIS — Z008 Encounter for other general examination: Secondary | ICD-10-CM

## 2017-11-15 DIAGNOSIS — F332 Major depressive disorder, recurrent severe without psychotic features: Secondary | ICD-10-CM | POA: Insufficient documentation

## 2017-11-15 DIAGNOSIS — F17210 Nicotine dependence, cigarettes, uncomplicated: Secondary | ICD-10-CM | POA: Insufficient documentation

## 2017-11-15 DIAGNOSIS — Z79899 Other long term (current) drug therapy: Secondary | ICD-10-CM | POA: Insufficient documentation

## 2017-11-15 DIAGNOSIS — F1910 Other psychoactive substance abuse, uncomplicated: Secondary | ICD-10-CM | POA: Insufficient documentation

## 2017-11-15 DIAGNOSIS — Z720 Tobacco use: Secondary | ICD-10-CM

## 2017-11-15 DIAGNOSIS — R45851 Suicidal ideations: Secondary | ICD-10-CM | POA: Insufficient documentation

## 2017-11-15 DIAGNOSIS — R059 Cough, unspecified: Secondary | ICD-10-CM

## 2017-11-15 DIAGNOSIS — Z9114 Patient's other noncompliance with medication regimen: Secondary | ICD-10-CM

## 2017-11-15 DIAGNOSIS — R05 Cough: Secondary | ICD-10-CM

## 2017-11-15 DIAGNOSIS — F1010 Alcohol abuse, uncomplicated: Secondary | ICD-10-CM | POA: Insufficient documentation

## 2017-11-15 DIAGNOSIS — Z9049 Acquired absence of other specified parts of digestive tract: Secondary | ICD-10-CM | POA: Insufficient documentation

## 2017-11-15 LAB — CBC
HCT: 40.3 % (ref 36.0–46.0)
Hemoglobin: 13.2 g/dL (ref 12.0–15.0)
MCH: 29.5 pg (ref 26.0–34.0)
MCHC: 32.8 g/dL (ref 30.0–36.0)
MCV: 90 fL (ref 78.0–100.0)
PLATELETS: 270 10*3/uL (ref 150–400)
RBC: 4.48 MIL/uL (ref 3.87–5.11)
RDW: 14.5 % (ref 11.5–15.5)
WBC: 8.2 10*3/uL (ref 4.0–10.5)

## 2017-11-15 LAB — COMPREHENSIVE METABOLIC PANEL
ALK PHOS: 93 U/L (ref 38–126)
ALT: 40 U/L (ref 14–54)
AST: 50 U/L — AB (ref 15–41)
Albumin: 3.5 g/dL (ref 3.5–5.0)
Anion gap: 9 (ref 5–15)
BILIRUBIN TOTAL: 0.5 mg/dL (ref 0.3–1.2)
BUN: 14 mg/dL (ref 6–20)
CALCIUM: 9.2 mg/dL (ref 8.9–10.3)
CHLORIDE: 107 mmol/L (ref 101–111)
CO2: 24 mmol/L (ref 22–32)
CREATININE: 0.86 mg/dL (ref 0.44–1.00)
GFR calc Af Amer: >60 mL/min (ref >60)
GFR calc non Af Amer: >60 mL/min (ref >60)
Glucose, Bld: 93 mg/dL (ref 65–99)
Potassium: 3.8 mmol/L (ref 3.5–5.1)
Sodium: 140 mmol/L (ref 135–145)
Total Protein: 7.5 g/dL (ref 6.5–8.1)

## 2017-11-15 LAB — RAPID URINE DRUG SCREEN, HOSP PERFORMED
AMPHETAMINES: NOT DETECTED
Barbiturates: NOT DETECTED
Benzodiazepines: NOT DETECTED
COCAINE: POSITIVE — AB
OPIATES: NOT DETECTED
TETRAHYDROCANNABINOL: POSITIVE — AB

## 2017-11-15 LAB — I-STAT BETA HCG BLOOD, ED (MC, WL, AP ONLY): I-stat hCG, quantitative: 6.8 m[IU]/mL — ABNORMAL HIGH (ref <5)

## 2017-11-15 LAB — SALICYLATE LEVEL: Salicylate Lvl: <7 mg/dL (ref 2.8–30.0)

## 2017-11-15 LAB — ACETAMINOPHEN LEVEL: Acetaminophen (Tylenol), Serum: <10 ug/mL — ABNORMAL LOW (ref 10–30)

## 2017-11-15 LAB — ETHANOL

## 2017-11-15 MED ORDER — NICOTINE 21 MG/24HR TD PT24
21.0000 mg | MEDICATED_PATCH | Freq: Every day | TRANSDERMAL | Status: DC
  Administered 2017-11-15: 21 mg via TRANSDERMAL
  Filled 2017-11-15: qty 1

## 2017-11-15 MED ORDER — PAROXETINE HCL 20 MG PO TABS: 20.0000 mg | ORAL_TABLET | Freq: Every morning | ORAL | Status: DC

## 2017-11-15 MED ORDER — LAMOTRIGINE 25 MG PO TABS
25.0000 mg | ORAL_TABLET | Freq: Every day | ORAL | Status: DC
  Administered 2017-11-15: 25 mg via ORAL
  Filled 2017-11-15: qty 1

## 2017-11-15 MED ORDER — BUSPIRONE HCL 10 MG PO TABS
10.0000 mg | ORAL_TABLET | Freq: Two times a day (BID) | ORAL | Status: DC
  Administered 2017-11-15: 10 mg via ORAL
  Filled 2017-11-15: qty 1

## 2017-11-15 MED ORDER — IBUPROFEN 200 MG PO TABS: 600.0000 mg | ORAL_TABLET | Freq: Three times a day (TID) | ORAL | Status: DC | PRN

## 2017-11-15 MED ORDER — METRONIDAZOLE 500 MG PO TABS
2000.0000 mg | ORAL_TABLET | Freq: Once | ORAL | Status: AC
  Administered 2017-11-15: 2000 mg via ORAL
  Filled 2017-11-15: qty 4

## 2017-11-15 MED ORDER — PREGABALIN 50 MG PO CAPS
75.0000 mg | ORAL_CAPSULE | Freq: Two times a day (BID) | ORAL | Status: DC
  Administered 2017-11-15: 75 mg via ORAL
  Filled 2017-11-15: qty 1

## 2017-11-15 MED ORDER — ALUM & MAG HYDROXIDE-SIMETH 200-200-20 MG/5ML PO SUSP: 30.0000 mL | Freq: Four times a day (QID) | ORAL | Status: DC | PRN

## 2017-11-15 MED ORDER — ONDANSETRON HCL 4 MG PO TABS: 4.0000 mg | ORAL_TABLET | Freq: Three times a day (TID) | ORAL | Status: DC | PRN

## 2017-11-15 MED ORDER — HYDROXYZINE HCL 25 MG PO TABS
50.0000 mg | ORAL_TABLET | Freq: Every day | ORAL | Status: DC
  Administered 2017-11-15: 50 mg via ORAL
  Filled 2017-11-15: qty 2

## 2017-11-15 MED ORDER — ALBUTEROL SULFATE HFA 108 (90 BASE) MCG/ACT IN AERS
1.0000 | INHALATION_SPRAY | RESPIRATORY_TRACT | Status: DC | PRN
  Administered 2017-11-15: 2 via RESPIRATORY_TRACT
  Filled 2017-11-15: qty 6.7

## 2017-11-15 MED ORDER — ZOLPIDEM TARTRATE 5 MG PO TABS: 5.0000 mg | ORAL_TABLET | Freq: Every evening | ORAL | Status: DC | PRN

## 2017-11-15 MED ORDER — GUAIFENESIN ER 600 MG PO TB12
600.0000 mg | ORAL_TABLET | Freq: Two times a day (BID) | ORAL | Status: DC | PRN
Start: 2017-11-15 — End: 2017-11-16
  Administered 2017-11-15: 600 mg via ORAL
  Filled 2017-11-15 (×2): qty 1

## 2017-11-15 NOTE — ED Notes (Signed)
Bed: [facility]42 Expected date:  Expected time:  Means of arrival:  Comments: Triage 3 

## 2017-11-15 NOTE — ED Notes (Signed)
Pt ambulatory  W/o dificulty from triage, to the bathroom on arrival

## 2017-11-15 NOTE — BH Assessment (Signed)
[facility] Assessment Progress Note  Case was staffed with Lord DNP who recommended a inpatient admission as appropriate bed placement is investigated.         

## 2017-11-15 NOTE — ED Notes (Signed)
Sandwich/crackers/soda given

## 2017-11-15 NOTE — ED Triage Notes (Signed)
Pt reports being on a crack alcohol and weed binge for three days. Pt states she has been depressed the past two weeks and is suicidal, pt reports she had 28 lyrica pills and she was going to take them all. Pt reports she is "off her meds"- reports she takes her meds when she wants.

## 2017-11-15 NOTE — ED Provider Notes (Signed)
[facility] DEPT Provider Note   CSN: [id number] Arrival date & time: 11/15/17  1508     History   Chief Complaint No chief complaint on file.   HPI Patricia Hall is a 52 y.o. female with a PMHx of bipolar 1 disorder, HepC, chronic headaches, polysubstance use, HLD, chronic bronchitis, and other conditions listed below, who presents to the ED with complaints of worsening depression and suicidal ideations for the last 2 weeks, and alcohol and polysubstance abuse.  Patient states that she has not been taking her psychiatric medications regularly for the last 2 weeks, she was prescribed BuSpar 10 mg twice daily, Vistaril 50 mg at bedtime, Paxil 20 mg every morning, Lamictal 25 mg daily, and Lyrica 75 mg twice daily.  She took some of them yesterday, but she has been inconsistent for at least the last 2 weeks.  She states that she has been binge drinking alcohol, unknown amounts over the last several days, but the last sip was yesterday.  She also states that she has been binging on marijuana and crack, also with the last use being yesterday for both.  She has been feeling suicidal with a plan to overdose on her Lyrica pills however she did not do so, and instead she threw out all of her Lyrica prescription.  She admits to being a cigarette smoker.  She denies HI or AVH.  She mentions that 4 days ago she tested positive for trichomonas but did not receive treatment, she is requesting treatment today.  She also states that for the last several weeks she has had URI symptoms, she was prescribed amoxicillin 500 mg twice daily for 7 days that was supposed to be started on 11/03/2017 however she only took about 4 pills and then stopped taking them, so she continues to have a cough, rhinorrhea, and wheezing.  She has an inhaler at home but it ran out.  No other treatments tried for the symptoms, no known aggravating factors.  She denies any fevers, chills, sore throat, CP, SOB, abd  pain, N/V/D/C, hematuria, dysuria, myalgias, arthralgias, numbness, tingling, focal weakness, or any other complaints at this time.  She is here voluntarily.    The history is provided by the patient and medical records. No language interpreter was used.    Past Medical History:  Diagnosis Date  . Bipolar 1 disorder ([place])   . Cholecystitis 05/12/2012  . Chronic daily headache 09/01/2014  . Hep C w/ coma, chronic ([place])   . Hyperlipemia   . Obese   . Paresthesia 09/01/2014  . Substance abuse ([place])   . Substance abuse ([place])    cocaine  . Vitamin D deficiency     Patient Active Problem List   Diagnosis Date Noted  . Substance induced mood disorder ([place]) 01/02/2017  . Bipolar disorder, current episode depressed, severe, without psychotic features ([place]) 02/04/2015  . Bipolar affective disorder, depressed ([place]) 02/04/2015  . Depression   . Suicidal ideation   . Paresthesia 09/01/2014  . Chronic daily headache 09/01/2014  . Acute cholecystitis 05/12/2012    Past Surgical History:  Procedure Laterality Date  . APPENDECTOMY    . CHOLECYSTECTOMY  05/12/2012   Procedure: LAPAROSCOPIC CHOLECYSTECTOMY WITH INTRAOPERATIVE CHOLANGIOGRAM;  Surgeon: Ralene Ok, MD;  Location: [facility];  Service: General;  Laterality: N/A;  . EYE SURGERY     strabismus surgery     OB History   None      Home Medications    Prior to  Admission medications   Medication Sig Start Date End Date Taking? Authorizing Provider  busPIRone (BUSPAR) 10 MG tablet Take 10 mg by mouth 2 (two) times daily. 11/04/17  Yes [provider]  hydrOXYzine (VISTARIL) 50 MG capsule Take 50 mg by mouth at bedtime. 11/13/17  Yes [provider]  lamoTRIgine (LAMICTAL) 25 MG tablet Take 25 mg by mouth daily. 11/04/17  Yes [provider]  PARoxetine (PAXIL) 20 MG tablet Take 20 mg by mouth every morning. 11/04/17  Yes [provider]  amoxicillin (AMOXIL) 500 MG capsule Take 1 capsule (500 mg  total) by mouth 2 (two) times daily. 08/16/17   Recardo Evangelist, PA-C  gabapentin (NEURONTIN) 300 MG capsule Take 1 capsule (300 mg total) by mouth 3 (three) times daily. 01/06/17   Derrill Center, NP  hydrOXYzine (ATARAX/VISTARIL) 25 MG tablet Take 1 tablet (25 mg total) by mouth 3 (three) times daily as needed for anxiety. Patient not taking: Reported on 11/15/2017 01/06/17   Derrill Center, NP  nicotine polacrilex (NICORETTE) 2 MG gum Take 1 each (2 mg total) by mouth as needed for smoking cessation. 01/06/17   Derrill Center, NP  OLANZapine (ZYPREXA) 10 MG tablet Take 1 tablet (10 mg total) by mouth at bedtime. 01/06/17   Derrill Center, NP  predniSONE (DELTASONE) 20 MG tablet Take 2 tablets (40 mg total) by mouth daily. 08/16/17   Recardo Evangelist, PA-C  rosuvastatin (CRESTOR) 10 MG tablet Take 1 tablet (10 mg total) by mouth at bedtime. Patient not taking: Reported on 01/01/2017 02/09/15   Kerrie Buffalo, NP  traZODone (DESYREL) 50 MG tablet Take 1 tablet (50 mg total) by mouth at bedtime and may repeat dose one time if needed. 01/06/17   Derrill Center, NP    Family History Family History  Problem Relation Age of Onset  . Heart disease Mother   . Diabetes Mother   . Diabetes Father   . Heart disease Father   . Diabetes Sister   . Diabetes Brother     Social History Social History   Tobacco Use  . Smoking status: Current Every Day Smoker    Packs/day: 1.00    Years: 25.00    Pack years: 25.00    Types: Cigarettes  . Smokeless tobacco: Never Used  Substance Use Topics  . Alcohol use: Yes    Comment: occasional  . Drug use: Yes    Types: "Crack" cocaine, Marijuana    Comment: daily cocaine use. as much as I can get.      Allergies   Patient has no known allergies.   Review of Systems Review of Systems  Constitutional: Negative for chills and fever.  HENT: Positive for rhinorrhea. Negative for sore throat.   Respiratory: Positive for cough and wheezing. Negative for  shortness of breath.   Cardiovascular: Negative for chest pain.  Gastrointestinal: Negative for abdominal pain, constipation, diarrhea, nausea and vomiting.  Genitourinary: Negative for dysuria and hematuria.  Musculoskeletal: Negative for arthralgias and myalgias.  Skin: Negative for color change.  Allergic/Immunologic: Negative for immunocompromised state.  Neurological: Negative for weakness and numbness.  Psychiatric/Behavioral: Positive for suicidal ideas. Negative for confusion and hallucinations.   All other systems reviewed and are negative for acute change except as noted in the HPI.    Physical Exam Updated Vital Signs BP 135/76 (BP Location: Left Arm)   Pulse 71   Temp 97.7 F (36.5 C) (Oral)   Resp 20  Ht 5' 4.5" (1.638 m)   Wt 99.3 kg (219 lb)   SpO2 95%   BMI 37.01 kg/m   Physical Exam  Constitutional: She is oriented to person, place, and time. Vital signs are normal. She appears well-developed and well-nourished.  Non-toxic appearance. No distress.  Afebrile, nontoxic, NAD  HENT:  Head: Normocephalic and atraumatic.  Mouth/Throat: Oropharynx is clear and moist and mucous membranes are normal.  Eyes: Conjunctivae and EOM are normal. Right eye exhibits no discharge. Left eye exhibits no discharge.  Neck: Normal range of motion. Neck supple.  Cardiovascular: Normal rate, regular rhythm, normal heart sounds and intact distal pulses. Exam reveals no gallop and no friction rub.  No murmur heard. Pulmonary/Chest: Effort normal. No respiratory distress. She has decreased breath sounds. She has no wheezes. She has no rhonchi. She has no rales.  Mildly diminished lung sounds throughout, but otherwise CTAB in all lung fields with no w/r/r, no hypoxia or increased WOB, speaking in full sentences, SpO2 95% on RA   Abdominal: Soft. Normal appearance and bowel sounds are normal. She exhibits no distension. There is no tenderness. There is no rigidity, no rebound, no guarding,  no CVA tenderness, no tenderness at McBurney's point and negative Murphy's sign.  Musculoskeletal: Normal range of motion.  Neurological: She is alert and oriented to person, place, and time. She has normal strength. No sensory deficit.  Skin: Skin is warm, dry and intact. No rash noted.  Psychiatric: Her mood appears anxious. She is not actively hallucinating. She exhibits a depressed mood. She expresses suicidal ideation. She expresses no homicidal ideation. She expresses suicidal plans. She expresses no homicidal plans.  Depressed and anxious affect, but pleasant and cooperative. Endorsing SI with a plan, denies HI or AVH, doesn't seem to be responding to internal stimuli.   Nursing note and vitals reviewed.    ED Treatments / Results  Labs (all labs ordered are listed, but only abnormal results are displayed) Labs Reviewed  COMPREHENSIVE METABOLIC PANEL - Abnormal; Notable for the following components:      Result Value   AST 50 (*)    All other components within normal limits  ACETAMINOPHEN LEVEL - Abnormal; Notable for the following components:   Acetaminophen (Tylenol), Serum <10 (*)    All other components within normal limits  RAPID URINE DRUG SCREEN, HOSP PERFORMED - Abnormal; Notable for the following components:   Cocaine POSITIVE (*)    Tetrahydrocannabinol POSITIVE (*)    All other components within normal limits  I-STAT BETA HCG BLOOD, ED ([facility], [facility], [facility] ONLY) - Abnormal; Notable for the following components:   I-stat hCG, quantitative 6.8 (*)    All other components within normal limits  ETHANOL  SALICYLATE LEVEL  CBC    EKG None  Radiology Dg Chest 2 View  Result Date: 11/15/2017 CLINICAL DATA:  Medical clearance. EXAM: CHEST - 2 VIEW COMPARISON:  10/30/2014 FINDINGS: Cardiomediastinal silhouette is normal. Mediastinal contours appear intact. There is no evidence of focal airspace consolidation, pleural effusion or pneumothorax. Osseous structures are without acute  abnormality. Soft tissues are grossly normal. IMPRESSION: No active cardiopulmonary disease. Electronically Signed   By: Fidela Salisbury M.D.   On: 11/15/2017 16:56    Procedures Procedures (including critical care time)  Medications Ordered in ED Medications  albuterol (PROVENTIL HFA;VENTOLIN HFA) 108 (90 Base) MCG/ACT inhaler 1-2 puff (2 puffs Inhalation Given 11/15/17 1713)  metroNIDAZOLE (FLAGYL) tablet 2,000 mg (has no administration in time range)  guaiFENesin (MUCINEX) 12  hr tablet 600 mg (has no administration in time range)  ibuprofen (ADVIL,MOTRIN) tablet 600 mg (has no administration in time range)  zolpidem (AMBIEN) tablet 5 mg (has no administration in time range)  ondansetron (ZOFRAN) tablet 4 mg (has no administration in time range)  alum & mag hydroxide-simeth (MAALOX/MYLANTA) 200-200-20 MG/5ML suspension 30 mL (has no administration in time range)  nicotine (NICODERM CQ - dosed in mg/24 hours) patch 21 mg (has no administration in time range)  busPIRone (BUSPAR) tablet 10 mg (has no administration in time range)  hydrOXYzine (ATARAX/VISTARIL) tablet 50 mg (has no administration in time range)  lamoTRIgine (LAMICTAL) tablet 25 mg (has no administration in time range)  PARoxetine (PAXIL) tablet 20 mg (has no administration in time range)  pregabalin (LYRICA) capsule 75 mg (has no administration in time range)     Initial Impression / Assessment and Plan / ED Course  I have reviewed the triage vital signs and the nursing notes.  Pertinent labs & imaging results that were available during my care of the patient were reviewed by me and considered in my medical decision making (see chart for details).     52 y.o. female here with suicidal ideations and alcohol abuse as well as polysubstance use.  She has been noncompliant with her medications.  She denies HI or AVH.  She is a tobacco user, smoking cessation was encouraged.  Her only complaint is a cough and URI symptoms  for the last several weeks, she was prescribed amoxicillin but she did not take it she continues to have symptoms.  On exam, mildly diminished lung sounds throughout, no wheezing or rhonchi appreciated, no increased work of breathing or hypoxia.  Otherwise benign physical exam.  We will get psych clearance labs as well as chest x-ray, give inhaler, and reassess shortly.  Of note, she states that she tested positive for trichomonas a few days ago but was not treated, she is requesting treatment today.  We will give her Flagyl but I doubt the need for repeat pelvic evaluation or STD testing at this time.  Will reassess shortly.  5:26 PM CXR negative; doubt need for further antibiotic therapies, likely viral illness, she will have inhaler while here and we can also rx mucinex for her cough. Lung sounds improved after inhaler use.  CBC WNL. CMP essentially unremarkable (marginally elevated AST 50, but otherwise remainder of CMP WNL). EtOH level undetectable. Salicylate and acetaminophen levels WNL. BetaHCG falsely positive at 6.8, doubt this is a true value. UDS with +cocaine and +THC but otherwise negative. Pt medically cleared at this time. Psych hold orders and home med orders placed. Please see TTS notes for further documentation of care/dispo. PLEASE NOTE THAT PT IS HERE VOLUNTARILY AT THIS TIME, IF PT TRIES TO LEAVE THEY WOULD NEED IVC PAPERWORK TAKEN OUT. Pt stable at time of med clearance.     Final Clinical Impressions(s) / ED Diagnoses   Final diagnoses:  Suicidal ideation  Polysubstance abuse ([place])  Alcohol abuse  Tobacco user  Medical clearance for psychiatric admission  Cough  Noncompliance with medications    ED Discharge Orders    [address] [place], [place] 11/15/17 [place]    Fredia Sorrow, MD 11/15/17 1735

## 2017-11-15 NOTE — ED Notes (Signed)
Up tot he bathroom to shower and change scrubs 

## 2017-11-15 NOTE — Progress Notes (Signed)
VOL paperwork signed and faxed to [facility].  Lind Covert, MSW, LCSW Therapeutic Triage Specialist  [phone]

## 2017-11-15 NOTE — ED Notes (Signed)
Patient alert and oriented. Patient is passive SI but contracts for safety. Patient denies HI and A/V hallucinations. Patient is pleasant and cooperative. Patient provided support and encouragement. Q 15 minute checks in progress and patient remains safe on unit. Monitoring continues.

## 2017-11-15 NOTE — ED Notes (Signed)
Bed: [facility]3 Expected date:  Expected time:  Means of arrival:  Comments: 

## 2017-11-15 NOTE — ED Notes (Signed)
3 pt belongins bags, 1 large brown canvess tote (approx 2x2x3 feet), a small blue tote, 1 black cain.

## 2017-11-15 NOTE — ED Notes (Signed)
PA at bedside.

## 2017-11-15 NOTE — BH Assessment (Signed)
[facility] Assessment Progress Note Per AC, patient has been accepted to [facility] 300-1 later this date. AC to coordinate.
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
## 2017-11-16 ENCOUNTER — Other Ambulatory Visit: Payer: Self-pay

## 2017-11-16 ENCOUNTER — Inpatient Hospital Stay (HOSPITAL_COMMUNITY)
Admission: AD | Admit: 2017-11-16 | Discharge: 2017-11-20 | DRG: 885 | Disposition: A | Discharge status: Home / Self Care | Payer: Medicare Other | Source: Intra-hospital | Attending: Psychiatry | Admitting: Psychiatry

## 2017-11-16 ENCOUNTER — Encounter (HOSPITAL_COMMUNITY): Payer: Self-pay | Admitting: *Deleted

## 2017-11-16 DIAGNOSIS — F064 Anxiety disorder due to known physiological condition: Secondary | ICD-10-CM | POA: Diagnosis present

## 2017-11-16 DIAGNOSIS — F1420 Cocaine dependence, uncomplicated: Secondary | ICD-10-CM | POA: Diagnosis present

## 2017-11-16 DIAGNOSIS — G629 Polyneuropathy, unspecified: Secondary | ICD-10-CM | POA: Diagnosis present

## 2017-11-16 DIAGNOSIS — E785 Hyperlipidemia, unspecified: Secondary | ICD-10-CM | POA: Diagnosis present

## 2017-11-16 DIAGNOSIS — F1994 Other psychoactive substance use, unspecified with psychoactive substance-induced mood disorder: Secondary | ICD-10-CM

## 2017-11-16 DIAGNOSIS — R45851 Suicidal ideations: Secondary | ICD-10-CM | POA: Diagnosis present

## 2017-11-16 DIAGNOSIS — F1490 Cocaine use, unspecified, uncomplicated: Secondary | ICD-10-CM | POA: Diagnosis not present

## 2017-11-16 DIAGNOSIS — M797 Fibromyalgia: Secondary | ICD-10-CM | POA: Diagnosis not present

## 2017-11-16 DIAGNOSIS — Z62810 Personal history of physical and sexual abuse in childhood: Secondary | ICD-10-CM | POA: Diagnosis present

## 2017-11-16 DIAGNOSIS — R51 Headache: Secondary | ICD-10-CM | POA: Diagnosis present

## 2017-11-16 DIAGNOSIS — Z6837 Body mass index (BMI) 37.0-37.9, adult: Secondary | ICD-10-CM

## 2017-11-16 DIAGNOSIS — F17210 Nicotine dependence, cigarettes, uncomplicated: Secondary | ICD-10-CM | POA: Diagnosis not present

## 2017-11-16 DIAGNOSIS — R21 Rash and other nonspecific skin eruption: Secondary | ICD-10-CM | POA: Diagnosis present

## 2017-11-16 DIAGNOSIS — F1290 Cannabis use, unspecified, uncomplicated: Secondary | ICD-10-CM

## 2017-11-16 DIAGNOSIS — G4700 Insomnia, unspecified: Secondary | ICD-10-CM | POA: Diagnosis not present

## 2017-11-16 DIAGNOSIS — F4310 Post-traumatic stress disorder, unspecified: Secondary | ICD-10-CM | POA: Diagnosis present

## 2017-11-16 DIAGNOSIS — G8929 Other chronic pain: Secondary | ICD-10-CM | POA: Diagnosis present

## 2017-11-16 DIAGNOSIS — F332 Major depressive disorder, recurrent severe without psychotic features: Secondary | ICD-10-CM | POA: Diagnosis present

## 2017-11-16 DIAGNOSIS — G4710 Hypersomnia, unspecified: Secondary | ICD-10-CM | POA: Diagnosis present

## 2017-11-16 DIAGNOSIS — R05 Cough: Secondary | ICD-10-CM | POA: Diagnosis present

## 2017-11-16 DIAGNOSIS — B182 Chronic viral hepatitis C: Secondary | ICD-10-CM | POA: Diagnosis present

## 2017-11-16 DIAGNOSIS — Z79899 Other long term (current) drug therapy: Secondary | ICD-10-CM | POA: Diagnosis not present

## 2017-11-16 DIAGNOSIS — Z9114 Patient's other noncompliance with medication regimen: Secondary | ICD-10-CM

## 2017-11-16 DIAGNOSIS — E669 Obesity, unspecified: Secondary | ICD-10-CM | POA: Diagnosis present

## 2017-11-16 DIAGNOSIS — F419 Anxiety disorder, unspecified: Secondary | ICD-10-CM | POA: Diagnosis not present

## 2017-11-16 DIAGNOSIS — F314 Bipolar disorder, current episode depressed, severe, without psychotic features: Secondary | ICD-10-CM | POA: Diagnosis present

## 2017-11-16 DIAGNOSIS — F1210 Cannabis abuse, uncomplicated: Secondary | ICD-10-CM | POA: Diagnosis not present

## 2017-11-16 LAB — LIPID PANEL
CHOLESTEROL: 190 mg/dL (ref 0–200)
HDL: 68 mg/dL (ref >40)
LDL Cholesterol: 115 mg/dL — ABNORMAL HIGH (ref 0–99)
TRIGLYCERIDES: 33 mg/dL (ref <150)
Total CHOL/HDL Ratio: 2.8 RATIO
VLDL: 7 mg/dL (ref 0–40)

## 2017-11-16 LAB — TSH: TSH: 0.616 u[IU]/mL (ref 0.350–4.500)

## 2017-11-16 LAB — HEMOGLOBIN A1C
HEMOGLOBIN A1C: 5.9 % — AB (ref 4.8–5.6)
MEAN PLASMA GLUCOSE: 122.63 mg/dL

## 2017-11-16 MED ORDER — BUSPIRONE HCL 10 MG PO TABS
10.0000 mg | ORAL_TABLET | Freq: Two times a day (BID) | ORAL | Status: DC
Start: 2017-11-16 — End: 2017-11-20
  Administered 2017-11-16 – 2017-11-20 (×9): 10 mg via ORAL
  Filled 2017-11-16 (×2): qty 28
  Filled 2017-11-16 (×4): qty 1
  Filled 2017-11-16: qty 28
  Filled 2017-11-16: qty 1
  Filled 2017-11-16 (×3): qty 28
  Filled 2017-11-16 (×4): qty 1

## 2017-11-16 MED ORDER — HYDROXYZINE HCL 25 MG PO TABS
25.0000 mg | ORAL_TABLET | Freq: Three times a day (TID) | ORAL | Status: DC | PRN
  Administered 2017-11-16 – 2017-11-19 (×6): 25 mg via ORAL
  Filled 2017-11-16 (×6): qty 1

## 2017-11-16 MED ORDER — LAMOTRIGINE 25 MG PO TABS
25.0000 mg | ORAL_TABLET | Freq: Every day | ORAL | Status: DC
  Administered 2017-11-16 – 2017-11-20 (×5): 25 mg via ORAL
  Filled 2017-11-16 (×2): qty 14
  Filled 2017-11-16: qty 1
  Filled 2017-11-16: qty 14
  Filled 2017-11-16 (×5): qty 1

## 2017-11-16 MED ORDER — ALUM & MAG HYDROXIDE-SIMETH 200-200-20 MG/5ML PO SUSP: 30.0000 mL | ORAL | Status: DC | PRN

## 2017-11-16 MED ORDER — ALBUTEROL SULFATE HFA 108 (90 BASE) MCG/ACT IN AERS
2.0000 | INHALATION_SPRAY | Freq: Four times a day (QID) | RESPIRATORY_TRACT | Status: DC | PRN
  Administered 2017-11-16 – 2017-11-19 (×7): 2 via RESPIRATORY_TRACT
  Filled 2017-11-16: qty 6.7

## 2017-11-16 MED ORDER — NICOTINE 21 MG/24HR TD PT24
21.0000 mg | MEDICATED_PATCH | Freq: Every day | TRANSDERMAL | Status: DC
  Administered 2017-11-16 – 2017-11-20 (×5): 21 mg via TRANSDERMAL
  Filled 2017-11-16 (×7): qty 1

## 2017-11-16 MED ORDER — ACETAMINOPHEN 325 MG PO TABS: 650.0000 mg | ORAL_TABLET | Freq: Four times a day (QID) | ORAL | Status: DC | PRN

## 2017-11-16 MED ORDER — TRAZODONE HCL 50 MG PO TABS
50.0000 mg | ORAL_TABLET | Freq: Every evening | ORAL | Status: DC | PRN
  Administered 2017-11-16 – 2017-11-19 (×4): 50 mg via ORAL
  Filled 2017-11-16: qty 14
  Filled 2017-11-16 (×4): qty 1

## 2017-11-16 MED ORDER — OLANZAPINE 5 MG PO TABS
5.0000 mg | ORAL_TABLET | Freq: Every day | ORAL | Status: DC
  Administered 2017-11-16 – 2017-11-19 (×4): 5 mg via ORAL
  Filled 2017-11-16 (×2): qty 14
  Filled 2017-11-16: qty 2
  Filled 2017-11-16: qty 1
  Filled 2017-11-16: qty 14
  Filled 2017-11-16 (×3): qty 1
  Filled 2017-11-16: qty 2

## 2017-11-16 MED ORDER — MAGNESIUM HYDROXIDE 400 MG/5ML PO SUSP: 30.0000 mL | Freq: Every day | ORAL | Status: DC | PRN

## 2017-11-16 MED ORDER — NICOTINE POLACRILEX 2 MG MT GUM: 2.0000 mg | CHEWING_GUM | OROMUCOSAL | Status: DC | PRN

## 2017-11-16 MED ORDER — IBUPROFEN 600 MG PO TABS
600.0000 mg | ORAL_TABLET | Freq: Four times a day (QID) | ORAL | Status: DC | PRN
  Administered 2017-11-16 – 2017-11-18 (×3): 600 mg via ORAL
  Filled 2017-11-16 (×4): qty 1

## 2017-11-16 NOTE — Progress Notes (Signed)
D: Pt was in dayroom upon initial approach.  She presents with anxious affect and mood.  She describes her day as "better."  Denies having a goal as she arrived to [facility] today.  Writer and pt made goal for pt to be safe.  She is seen laughing with peers at times.  Denies SI/HI and hallucinations.  She reports generalized pain of 9/10 "because I was walking so much."  She chose not to attend evening group stating "I go to NA, not AA."    A: Introduced self to pt and offered support and encouragement.  Medication administered per order.  Medication education provided.  PRN medication administered for anxiety, sleep, and pain.  Q15 minute safety checks maintained.  R: Pt is compliant with medications.  She verbally contracts for safety and reports she will inform staff of needs and concerns.  Will continue to monitor and assess.

## 2017-11-16 NOTE — Tx Team (Signed)
Initial Treatment Plan 11/16/2017 2:47 AM Patricia Hall AVW:[id number]    PATIENT STRESSORS: Financial difficulties Health problems Medication change or noncompliance Substance abuse   PATIENT STRENGTHS: Active sense of humor Average or above average intelligence   PATIENT IDENTIFIED PROBLEMS: Suicidal Ideation  Depression  Substance abuse  "get straight on my meds"  "go to [facility], then back home to [place]"             DISCHARGE CRITERIA:  Ability to meet basic life and health needs Adequate post-discharge living arrangements Improved stabilization in mood, thinking, and/or behavior Motivation to continue treatment in a less acute level of care Need for constant or close observation no longer present Verbal commitment to aftercare and medication compliance Withdrawal symptoms are absent or subacute and managed without 24-hour nursing intervention  PRELIMINARY DISCHARGE PLAN: Attend aftercare/continuing care group Attend 12-step recovery group  PATIENT/FAMILY INVOLVEMENT: This treatment plan has been presented to and reviewed with the patient, Patricia Hall.  The patient and family have been given the opportunity to ask questions and make suggestions.  Margaretann Loveless, RN 11/16/2017, 2:47 AM

## 2017-11-16 NOTE — [facility] Group Notes (Signed)
11/16/2017                   Type of Therapy and Topic:  Group Therapy: Anger Cues and Responses  Participation Level:  Active  In this group, patients learned how to define boundaries, discussed the different types or boundaries with examples.  They identified times that boundaries had been violated and how they reacted.  They analyzed how their reaction was possibly beneficial and how it was possibly unhelpful.  The group discussed how to set boundaries, respect others boundaries and communicate their boundaries. The group utilized a role play age appropriate scenario and discussed how each person in the scenario could have reacted differently. Patients will explore discussion questions that address media influence and why it is hard to set boundaries.   Therapeutic Goals: 1. Patients will define boundaries and explore (physical, personal space and language boundaries)  2. Patients will remember their last incident where their boundaries were violated and how they behaved 3. Patients will practice empathy and understanding of other's boundaries and learn from others in group 4. Patients will explore how they may have crossed another person's boundaries in the past.  5. Patients will learn healthy ways to set and communicate boundaries. 6. Patients will actively engage in group utilizing role play   Summary of Patient Progress:  Patient was engaged and participated throughout the group session. The patient shared one thing that they learned was "I need to set boundaries". Patient shared wanting to know more about "being able to go home". Patient reports this topic is important because "It helps you".  Therapeutic Modalities:   Cognitive Behavioral Therapy  Lidie Glade, [place]  11/16/2017 12:04 PM
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
## 2017-11-16 NOTE — Progress Notes (Signed)
Patient ID: [facility], female   DOB: [birth year], 52 y.o.   MRN: [id number]   Per State regulations 482.30 this chart was reviewed for medical necessity with respect to the patient's admission/duration of stay.    Next review date: 11/20/17  Debarah Crape, BSN, RN-BC  Case Manager

## 2017-11-16 NOTE — [facility] Suicide Risk Assessment (Signed)
[facility] INPATIENT:  Family/Significant Other Suicide Prevention Education  Suicide Prevention Education:  Patient Refusal for Family/Significant Other Suicide Prevention Education: The patient [facility] has refused to provide written consent for family/significant other to be provided Family/Significant Other Suicide Prevention Education during admission and/or prior to discharge.  Physician notified.  Kessler Kopinski 11/16/2017, 11:58 AM

## 2017-11-16 NOTE — BHH Suicide Risk Assessment (Addendum)
[facility] Admission Suicide Risk Assessment   Nursing information obtained from:   staff and chart Demographic factors:   52 year old female, separated, one adult daughter Current Mental Status:   see below Loss Factors:   homelessness, relapse  Historical Factors:   history of substance dependencies, identifies cocaine and cannabis as substances of choice  Risk Reduction Factors:   resilience   Total Time spent with patient: 45 minutes Principal Problem: Substance Induced Mood Disorder, Cocaine , Cannabis Use Disorder Diagnosis:   Patient Active Problem List   Diagnosis Date Noted  . Severe recurrent major depression without psychotic features ([place]) [F33.2] 11/16/2017  . Substance induced mood disorder ([place]) [F19.94] 01/02/2017  . Bipolar disorder, current episode depressed, severe, without psychotic features ([place]) [F31.4] 02/04/2015  . Bipolar affective disorder, depressed ([place]) [F31.30] 02/04/2015  . Depression [F32.9]   . Suicidal ideation [R45.851]   . Paresthesia [R20.2] 09/01/2014  . Chronic daily headache [R51] 09/01/2014  . Acute cholecystitis [K81.0] 05/12/2012     Continued Clinical Symptoms:  Alcohol Use Disorder Identification Test Final Score (AUDIT): 26 The "Alcohol Use Disorders Identification Test", Guidelines for Use in Primary Care, Second Edition.  World Pharmacologist [facility]). Score between 0-7:  no or low risk or alcohol related problems. Score between 8-15:  moderate risk of alcohol related problems. Score between 16-19:  high risk of alcohol related problems. Score 20 or above:  warrants further diagnostic evaluation for alcohol dependence and treatment.   CLINICAL FACTORS:  52 year old separated  female. Has one adult daughter who lives in [place],   Purdy to the hospital reporting worsening depression and suicidal ideations, with thoughts of overdosing . She has a history of substance abuse . States she has been using cocaine , cannabis, and to a  lesser degree alcohol , particularly over the last several days . ( Identifies Cocaine as substance of choice. States she has been drinking 1-2 beers per day, and not drinking daily. She is not presenting with any symptoms of alcohol WDL, vitals are stable, admission BAL negative ) Reports she has been homeless, recently staying in a hotel " where everyone was using and selling drugs ". States " I was scared for my life ". States " I knew I needed help, I don't want to die and be another statistic". Admission UDS positive for Cocaine and Cannabis . Admission BAL negative . States she has not been taking her psychiatric medications over the last month.  Known to our unit staff from prior psychiatric admissions, most recently 12/2016. At the time was admitted for substance abuse and suicidal ideations . Was diagnosed with Substance Induced Mood Disorder. At the time was discharged on Zyprexa, Trazodone, Neurontin. Patient has been diagnosed with Bipolar Disorder and PTSD. She states that even when sober she continues to have " a lot of mood swings ".  Medical History remarkable for Hep C (+)  Dx- Cocaine Use Disorder, Cannabis Use Disorder, Substance Induced Mood Disorder versus Bipolar Disorder, Depressed   Plan- Inpatient admission. We discussed treatment options - interested in continuing Buspar for anxiety, and in  restarting Zyprexa and Lamictal for mood disorder-  she has been on these  before , and does not remember having side effects     Musculoskeletal: Strength & Muscle Tone: within normal limits Gait & Station: normal Patient leans: N/A  Psychiatric Specialty Exam: Physical Exam  ROS mild headache, no chest pain, no shortness of breath, no nausea, no vomiting, no fever,  no chills   Blood pressure (!) 108/59, pulse 72, temperature 98.5 F (36.9 C), temperature source Oral, resp. rate 18, height 5' 4.5" (1.638 m), weight 99.3 kg (219 lb).Body mass index is 37.01 kg/m.  General  Appearance: Fairly Groomed  Eye Contact:  Fair  Speech:  Normal Rate  Volume:  Normal  Mood:  depressed, but affect reactive, smiles at times appropriately   Affect:  reactive, smiles at times appropriately   Thought Process:  Linear and Descriptions of Associations: Intact  Orientation:  Other:  fully alert and attentive   Thought Content:  no hallucinations, no delusions, not internally preoccupied   Suicidal Thoughts:  No currently denies suicidal or self injurious ideations, and is future oriented, states that her plan is to relocate to [place] to be close to her daughter in the near future   Homicidal Thoughts:  No  Memory:  recent and remote grossly intact   Judgement:  Fair  Insight:  Fair  Psychomotor Activity:  Normal- no tremors, no diaphoresis, no restlessness or agitation  Concentration:  Concentration: Good and Attention Span: Good  Recall:  Good  Fund of Knowledge:  Good  Language:  Good  Akathisia:  Negative  Handed:  Right  AIMS (if indicated):     Assets:  Communication Skills Desire for Improvement Resilience  ADL's:  Intact  Cognition:  WNL  Sleep:  Number of Hours: 3      COGNITIVE FEATURES THAT CONTRIBUTE TO RISK:  Closed-mindedness and Loss of executive function    SUICIDE RISK:   Moderate:  Frequent suicidal ideation with limited intensity, and duration, some specificity in terms of plans, no associated intent, good self-control, limited dysphoria/symptomatology, some risk factors present, and identifiable protective factors, including available and accessible social support.  PLAN OF CARE: Patient will be admitted to inpatient psychiatric unit for stabilization and safety. Will provide and encourage milieu participation. Provide medication management and maked adjustments as needed.  Will follow daily.    I certify that inpatient services furnished can reasonably be expected to improve the patient's condition.   Jenne Campus, MD 11/16/2017, 8:34  AM

## 2017-11-16 NOTE — Progress Notes (Signed)
Admission note:  Pt is a 52 year old Caucasian female admitted to the services of Dr. Parke Poisson for substance abuse, depression, and suicidal ideation.  Pt has relapsed on crack, THC, and alcohol.  Pt states that she wants to get back on her prescription medication, go to [facility], and then move back to [place] where her daughter lives.  Pt states daughter has wanted her to come back there for years.  Pt states she has five grandchildren there.  Pt states that she continues to have passive SI but verbally agrees to not harm herself while admitted to the hospital.  Pt is cooperative with the admission process.  She is homeless and brought all of her belongings to the hospital.  Pt is an every day smoker and will need a nicotine patch.   She denies other withdrawal symptoms at this time.

## 2017-11-16 NOTE — Plan of Care (Signed)
  Problem: Coping: Goal: Ability to verbalize frustrations and anger appropriately will improve Outcome: Progressing Goal: Ability to demonstrate self-control will improve Outcome: Progressing
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
## 2017-11-16 NOTE — Progress Notes (Signed)
D. Pt presents with a flat affect and anxious mood, but pleasant and cooperative during interactions. Per pt's self inventory, pt rates her depression, hopelessness and anxiety a 6/5/3, respectively. Pt writes that her goal today is "getting my head together" and "stay humble".  Pt currently endorses passive SI- reporting that she sometimes has thoughts. Pt denies A/V hallucinations. Pt observed interacting with peer appropriately in dayroom this am. A. Labs and vitals monitored. Pt compliant with medications. Pt supported emotionally and encouraged to express concerns and ask questions.   R. Pt remains safe with 15 minute checks. Will continue POC.

## 2017-11-16 NOTE — ED Notes (Signed)
Patient alert and oriented. Patient discharged from [facility] for admission to [facility]. Patient aware of admission and voices no concerns. Patient vitals 98.5-110/66-69-18-100% room air. Patient transported via [facility] and all paperwork and patient belongings given to transport personal. Patient denies pain. Patient left ambulatory with [facility] staff. Report called to Genia Hotter at [facility].

## 2017-11-16 NOTE — Progress Notes (Signed)
Pt did not attend orientation/ goals group today.

## 2017-11-16 NOTE — Progress Notes (Signed)
Patient did not attend the evening speaker AA meeting. Pt was notified that group was beginning but returned to her room.   

## 2017-11-17 MED ORDER — PREGABALIN 25 MG PO CAPS
25.0000 mg | ORAL_CAPSULE | Freq: Two times a day (BID) | ORAL | Status: DC
  Administered 2017-11-17 – 2017-11-20 (×7): 25 mg via ORAL
  Filled 2017-11-17 (×7): qty 1

## 2017-11-17 NOTE — BHH Group Notes (Signed)
LCSW Group Therapy Note 11/17/2017 1:00 PM  Type of Therapy and Topic: Group Therapy: Overcoming Obstacles  Participation Level: Active  Description of Group:  In this group patients will be encouraged to explore what they see as obstacles to their own wellness and recovery. They will be guided to discuss their thoughts, feelings, and behaviors related to these obstacles. The group will process together ways to cope with barriers, with attention given to specific choices patients can make. Each patient will be challenged to identify changes they are motivated to make in order to overcome their obstacles. This group will be process-oriented, with patients participating in exploration of their own experiences as well as giving and receiving support and challenge from other group members.  Therapeutic Goals: 1. Patient will identify personal and current obstacles as they relate to admission. 2. Patient will identify barriers that currently interfere with their wellness or overcoming obstacles.  3. Patient will identify feelings, thought process and behaviors related to these barriers. 4. Patient will identify two changes they are willing to make to overcome these obstacles:   Summary of Patient Progress  Teshia was engaged and participated throughout the group session. Silvia reports that her obstacle before coming to the hospital was "smoking crack and my negative thoughts". Talissa states that while in the hospital she hopes to learn better coping skills and she wants to change her environment and focus on family to ensure she remains sober.    Therapeutic Modalities:  Cognitive Behavioral Therapy Solution Focused Therapy Motivational Interviewing Relapse Prevention Therapy   Theresa Duty Clinical Social Worker

## 2017-11-17 NOTE — Progress Notes (Signed)
D:  Patient's self inventory sheet, patient sleeps good, sleep medication helpful.  Good appetite, high energy level.  Denied depression and hopeless, rated anxiety 2.  Withdrawals, cravings.  Denied SI.  Physical problems, pain, physical pain, legs and feet, worst pain #9 in past 24 hours, pain medication not helpful.  Goal is get meds straight.  Plans to focus on herself.  Does have discharge plans. A:  Medications administered per MD orders.  Emotional support and encouragement given patient. R:  Patient denied SI and HI, contracts for safety.  Denied A/V hallucinations.  Safety maintained with 15 minute checks.

## 2017-11-17 NOTE — Progress Notes (Signed)
[facility] MD Progress Note  11/17/2017 8:33 AM Patricia Hall  MRN:  [id number] Subjective:  Patient reports she is feeling " a little better", regarding her mood. She reports chronic bilateral  lower extremity pain related to neuropathy, and states " I have been on Lyrica for years, it is the only thing that works".  Denies suicidal ideations, and presents future oriented. States that her plan is to go to Rehab initially, and then to relocate to [place] to live with or near her daughter. Denies medication side effects. Objective: I have discussed case with treatment team and have met with patient. 52 year old separated female, who presented to hospital due to worsening depression, suicidal ideations of overdosing . History of polysubstance Abuse, identifies cocaine as substance of choice. Today reports her mood is improving , denies suicidal ideations, and presents future oriented, wanting to relocate to [place] after she completes rehab. She presents somatically focused, and reports chronic lower extremity pain, which she attributes to neuropathy. States that she has been on  Lyrica on and off for years, denies side effects.   Noted to be visible in dayroom, interacting with peers .  Labs reviewed as below. TSH 0.616, HgbA1C 5.9      Principal Problem: Severe recurrent major depression without psychotic features ([place]) Diagnosis:   Patient Active Problem List   Diagnosis Date Noted  . Severe recurrent major depression without psychotic features ([place]) [F33.2] 11/16/2017  . Substance induced mood disorder ([place]) [F19.94] 01/02/2017  . Bipolar disorder, current episode depressed, severe, without psychotic features ([place]) [F31.4] 02/04/2015  . Bipolar affective disorder, depressed ([place]) [F31.30] 02/04/2015  . Depression [F32.9]   . Suicidal ideation [R45.851]   . Paresthesia [R20.2] 09/01/2014  . Chronic daily headache [R51] 09/01/2014  . Acute cholecystitis [K81.0] 05/12/2012   Total Time spent  with patient: 20 minutes  Past Medical History:  Past Medical History:  Diagnosis Date  . Bipolar 1 disorder ([place])   . Cholecystitis 05/12/2012  . Chronic daily headache 09/01/2014  . Hep C w/ coma, chronic ([place])   . Hyperlipemia   . Obese   . Paresthesia 09/01/2014  . Substance abuse ([place])   . Substance abuse ([place])    cocaine  . Vitamin D deficiency     Past Surgical History:  Procedure Laterality Date  . APPENDECTOMY    . CHOLECYSTECTOMY  05/12/2012   Procedure: LAPAROSCOPIC CHOLECYSTECTOMY WITH INTRAOPERATIVE CHOLANGIOGRAM;  Surgeon: Ralene Ok, MD;  Location: [facility];  Service: General;  Laterality: N/A;  . EYE SURGERY     strabismus surgery   Family History:  Family History  Problem Relation Age of Onset  . Heart disease Mother   . Diabetes Mother   . Diabetes Father   . Heart disease Father   . Diabetes Sister   . Diabetes Brother    Social History:  Social History   Substance and Sexual Activity  Alcohol Use Yes   Comment: occasional     Social History   Substance and Sexual Activity  Drug Use Yes  . Types: "Crack" cocaine, Marijuana   Comment: daily cocaine use. as much as I can get.     Social History   Socioeconomic History  . Marital status: Legally Separated    Spouse name: Not on file  . Number of children: Not on file  . Years of education: Not on file  . Highest education level: Not on file  Occupational History  . Not on file  Social Needs  .  Financial resource strain: Not on file  . Food insecurity:    Worry: Not on file    Inability: Not on file  . Transportation needs:    Medical: Not on file    Non-medical: Not on file  Tobacco Use  . Smoking status: Current Every Day Smoker    Packs/day: 1.00    Years: 25.00    Pack years: 25.00    Types: Cigarettes  . Smokeless tobacco: Never Used  Substance and Sexual Activity  . Alcohol use: Yes    Comment: occasional  . Drug use: Yes    Types: "Crack" cocaine, Marijuana     Comment: daily cocaine use. as much as I can get.   Marland Kitchen Sexual activity: Yes    Birth control/protection: Condom  Lifestyle  . Physical activity:    Days per week: Not on file    Minutes per session: Not on file  . Stress: Not on file  Relationships  . Social connections:    Talks on phone: Not on file    Gets together: Not on file    Attends religious service: Not on file    Active member of club or organization: Not on file    Attends meetings of clubs or organizations: Not on file    Relationship status: Not on file  Other Topics Concern  . Not on file  Social History Narrative  . Not on file   Additional Social History:   Oh Sleep: Fair  Appetite:  Good  Current Medications: Current Facility-Administered Medications  Medication Dose Route Frequency Provider Last Rate Last Dose  . albuterol (PROVENTIL HFA;VENTOLIN HFA) 108 (90 Base) MCG/ACT inhaler 2 puff  2 puff Inhalation Q6H PRN [name], [name], MD   2 puff at 11/17/17 0746  . alum & mag hydroxide-simeth (MAALOX/MYLANTA) 200-200-20 MG/5ML suspension 30 mL  30 mL Oral Q4H PRN Lindon Romp A, NP      . busPIRone (BUSPAR) tablet 10 mg  10 mg Oral BID Lindon Romp A, NP   10 mg at 11/17/17 0748  . hydrOXYzine (ATARAX/VISTARIL) tablet 25 mg  25 mg Oral TID PRN Lindon Romp A, NP   25 mg at 11/17/17 0753  . ibuprofen (ADVIL,MOTRIN) tablet 600 mg  600 mg Oral Q6H PRN Lindon Romp A, NP   600 mg at 11/17/17 0753  . lamoTRIgine (LAMICTAL) tablet 25 mg  25 mg Oral Daily [name], [name], MD   25 mg at 11/17/17 0748  . magnesium hydroxide (MILK OF MAGNESIA) suspension 30 mL  30 mL Oral Daily PRN Lindon Romp A, NP      . nicotine (NICODERM CQ - dosed in mg/24 hours) patch 21 mg  21 mg Transdermal Daily [name], [name], MD   21 mg at 11/17/17 0749  . OLANZapine (ZYPREXA) tablet 5 mg  5 mg Oral QHS [name], [name], MD   5 mg at 11/16/17 2100  . traZODone (DESYREL) tablet 50 mg  50 mg Oral QHS PRN Rozetta Nunnery, NP   50 mg at  11/16/17 2100    Lab Results:  Results for orders placed or performed during the hospital encounter of 11/16/17 (from the past 48 hour(s))  Hemoglobin A1c     Status: Abnormal   Collection Time: 11/16/17  6:11 AM  Result Value Ref Range   Hgb A1c MFr Bld 5.9 (H) 4.8 - 5.6 %    Comment: (NOTE) Pre diabetes:          5.7%-6.4%  Diabetes:              >6.4% Glycemic control for   <7.0% adults with diabetes    Mean Plasma Glucose 122.63 mg/dL    Comment: Performed at [facility] [address], [place], [place] [address]  Lipid panel     Status: Abnormal   Collection Time: 11/16/17  6:11 AM  Result Value Ref Range   Cholesterol 190 0 - 200 mg/dL   Triglycerides 33 <150 mg/dL   HDL 68 >40 mg/dL   Total CHOL/HDL Ratio 2.8 RATIO   VLDL 7 0 - 40 mg/dL   LDL Cholesterol 115 (H) 0 - 99 mg/dL    Comment:        Total Cholesterol/HDL:CHD Risk Coronary Heart Disease Risk Table                     Men   Women  1/2 Average Risk   3.4   3.3  Average Risk       5.0   4.4  2 X Average Risk   9.6   7.1  3 X Average Risk  23.4   11.0        Use the calculated Patient Ratio above and the CHD Risk Table to determine the patient's CHD Risk.        ATP III CLASSIFICATION (LDL):  <100     mg/dL   Optimal  100-129  mg/dL   Near or Above                    Optimal  130-159  mg/dL   Borderline  160-189  mg/dL   High  >190     mg/dL   Very High Performed at [facility] [address], [place], [place] [address]   TSH     Status: None   Collection Time: 11/16/17  6:11 AM  Result Value Ref Range   TSH 0.616 0.350 - 4.500 uIU/mL    Comment: Performed by a 3rd Generation assay with a functional sensitivity of <=0.01 uIU/mL. Performed at [facility], [place] [address], [place], [place] [address]     Blood Alcohol level:  Lab Results  Component Value Date   ETH <10 11/15/2017   ETH <5 54/62/7035    Metabolic Disorder Labs: Lab Results   Component Value Date   HGBA1C 5.9 (H) 11/16/2017   MPG 122.63 11/16/2017   MPG 131 02/09/2015   Lab Results  Component Value Date   PROLACTIN 14.8 01/02/2017   Lab Results  Component Value Date   CHOL 190 11/16/2017   TRIG 33 11/16/2017   HDL 68 11/16/2017   CHOLHDL 2.8 11/16/2017   VLDL 7 11/16/2017   LDLCALC 115 (H) 11/16/2017   LDLCALC 131 (H) 01/02/2017    Physical Findings: AIMS: Facial and Oral Movements Muscles of Facial Expression: None, normal Lips and Perioral Area: None, normal Jaw: None, normal Tongue: None, normal,Extremity Movements Upper (arms, wrists, hands, fingers): None, normal Lower (legs, knees, ankles, toes): None, normal, Trunk Movements Neck, shoulders, hips: None, normal, Overall Severity Severity of abnormal movements (highest score from questions above): None, normal Incapacitation due to abnormal movements: None, normal Patient's awareness of abnormal movements (rate only patient's report): No Awareness, Dental Status Current problems with teeth and/or dentures?: No Does patient usually wear dentures?: No  CIWA:  CIWA-Ar Total: 0 COWS:     Musculoskeletal: Strength & Muscle Tone: within normal  limits Gait & Station: normal Patient leans: N/A  Psychiatric Specialty Exam: Physical Exam  ROS  Blood pressure 100/63, pulse 67, temperature 97.8 F (36.6 C), temperature source Oral, resp. rate 18, height 5' 4.5" (1.638 m), weight 99.3 kg (219 lb).Body mass index is 37.01 kg/m.  General Appearance: Fairly Groomed  Eye Contact:  Good  Speech:  Clear and Coherent  Volume:  Normal  Mood:  States that her mood is improving, affect presents more reactive  Affect:  More reactive, not tearful, smiles at times appropriately  Thought Process:  Linear and Descriptions of Associations: Intact  Orientation:  Full (Time, Place, and Person)  Thought Content:  No hallucinations, no delusions, not internally preoccupied, future oriented, somatically  focused  Suicidal Thoughts:  No at this time denies suicidal ideations, denies self-injurious ideations, denies homicidal ideations  Homicidal Thoughts:  No  Memory:  Recent and remote grossly intact  Judgement:  Other:  fair improving,   Insight:  Fair and improving   Psychomotor Activity:  Normal  Concentration:  Concentration: Good and Attention Span: Good  Recall:  Good  Fund of Knowledge:  Good  Language:  Good  Akathisia:  Negative  Handed:  Right  AIMS (if indicated):     Assets:  Communication Skills Desire for Improvement Resilience  ADL's:  Intact  Cognition:  WNL  Sleep:  Number of Hours: 5.75   Assessment -patient reporting improving mood compared to how she felt prior to admission.  At this time denies suicidal ideations and is clearly future oriented, with a plan of going to a rehab setting after which she wants to relocate to [place] to live with her daughter.  Denies suicidal ideations at this time, denies medication side effects. Presents with somatic concerns, mainly chronic lower extremity pain, for which she states she has been on Lyrica "on and off" over period of years, without side effects. States this medication has been effective and well tolerated.  States she also tried Neurontin in the past but it did not help. Will restart at low dose and gradually titrate to minimize potential side effects or drug drug interactions/sedation  Treatment Plan Summary: Daily contact with patient to assess and evaluate symptoms and progress in treatment, Medication management, Plan inpatient treatment and medications as below Encourage group and milieu participation to work on coping skills and symptom reduction Encourage efforts to work on sobriety and relapse prevention Treatment team working on disposition plans, as above she is interested in going to a rehab setting at discharge Continue Buspar 10 mgrs BID for anxiety  Continue Lamictal 25 mgrs QDAY for mood disorder,  titrate gradually as tolerated Continue Zyprexa 5 mgrs QHS for mood disorder Continue Trazodone 50 mgrs QHS PRN for insomnia as needed  Continue Vistaril 25 mgrs Q 8 hours PRN for anxiety as needed Start Lyrica 25 mgrs BID initially for chronic pain, titrate gradually as tolerated  Jenne Campus, MD 11/17/2017, 8:33 AM

## 2017-11-17 NOTE — Progress Notes (Signed)
Recreation Therapy Notes  Date: 5.6.19 Time: 0930 Location: [address] Dayroom  Group Topic: Stress Management  Goal Area(s) Addresses:  Patient will verbalize importance of using healthy stress management.  Patient will identify positive emotions associated with healthy stress management.   Behavioral Response: Engaged  Intervention: Stress Management  Activity :  Guided Imagery.  LRT introduced the stress management technique of guided imagery.  LRT read a script that guided patients through a peaceful outing in a meadow.  Patients were to follow along as LRT read script to engage in activity.  Education:  Stress Management, Discharge Planning.   Education Outcome: Acknowledges edcuation/In group clarification offered/Needs additional education  Clinical Observations/Feedback: Pt attended and participated in group.     Victorino Sparrow, LRT/CTRS         Ria Comment, Forrestine Lecrone A 11/17/2017 10:51 AM

## 2017-11-17 NOTE — Progress Notes (Signed)
D: Pt denies SI/HI/AV hallucinations. Pt is in a pleasant mood. Pt goal for today is to work on her discharge plan.  A: Pt was offered support and encouragement. Pt was given scheduled medications. Pt was encourage to attend groups. Q 15 minute checks were done for safety.  R:Pt attends groups and interacts well with peers and staff. Pt is taking medication. Pt has no complaints.Pt receptive to treatment and safety maintained on unit.

## 2017-11-17 NOTE — Tx Team (Signed)
Interdisciplinary Treatment and Diagnostic Plan Update  11/17/2017 Time of Session: 9:40am Areal Cochrane MRN: [id number]  Principal Diagnosis: Severe recurrent major depression without psychotic features [facility])  Secondary Diagnoses: Principal Problem:   Severe recurrent major depression without psychotic features ([place])   Current Medications:  Current Facility-Administered Medications  Medication Dose Route Frequency Provider Last Rate Last Dose  . albuterol (PROVENTIL HFA;VENTOLIN HFA) 108 (90 Base) MCG/ACT inhaler 2 puff  2 puff Inhalation Q6H PRN [name], [name], MD   2 puff at 11/17/17 0746  . alum & mag hydroxide-simeth (MAALOX/MYLANTA) 200-200-20 MG/5ML suspension 30 mL  30 mL Oral Q4H PRN Lindon Romp A, NP      . busPIRone (BUSPAR) tablet 10 mg  10 mg Oral BID Lindon Romp A, NP   10 mg at 11/17/17 0748  . hydrOXYzine (ATARAX/VISTARIL) tablet 25 mg  25 mg Oral TID PRN Lindon Romp A, NP   25 mg at 11/17/17 0753  . ibuprofen (ADVIL,MOTRIN) tablet 600 mg  600 mg Oral Q6H PRN Lindon Romp A, NP   600 mg at 11/17/17 0753  . lamoTRIgine (LAMICTAL) tablet 25 mg  25 mg Oral Daily [name], [name], MD   25 mg at 11/17/17 0748  . magnesium hydroxide (MILK OF MAGNESIA) suspension 30 mL  30 mL Oral Daily PRN Lindon Romp A, NP      . nicotine (NICODERM CQ - dosed in mg/24 hours) patch 21 mg  21 mg Transdermal Daily [name], [name], MD   21 mg at 11/17/17 0749  . OLANZapine (ZYPREXA) tablet 5 mg  5 mg Oral QHS [name], [name], MD   5 mg at 11/16/17 2100  . pregabalin (LYRICA) capsule 25 mg  25 mg Oral BID [name], [name], MD   25 mg at 11/17/17 1112  . traZODone (DESYREL) tablet 50 mg  50 mg Oral QHS PRN Lindon Romp A, NP   50 mg at 11/16/17 2100   PTA Medications: Medications Prior to Admission  Medication Sig Dispense Refill Last Dose  . amoxicillin (AMOXIL) 500 MG capsule Take 1 capsule (500 mg total) by mouth 2 (two) times daily. (Patient not taking: Reported on 11/15/2017) 14  capsule 0 Not Taking at Unknown time  . busPIRone (BUSPAR) 10 MG tablet Take 10 mg by mouth 2 (two) times daily.  0 11/14/2017 at Unknown time  . gabapentin (NEURONTIN) 300 MG capsule Take 1 capsule (300 mg total) by mouth 3 (three) times daily. (Patient not taking: Reported on 11/15/2017) 90 capsule 0 Not Taking at Unknown time  . hydrOXYzine (ATARAX/VISTARIL) 25 MG tablet Take 1 tablet (25 mg total) by mouth 3 (three) times daily as needed for anxiety. (Patient not taking: Reported on 11/15/2017) 30 tablet 0 Not Taking at Unknown time  . hydrOXYzine (VISTARIL) 50 MG capsule Take 50 mg by mouth at bedtime.   11/14/2017 at Unknown time  . lamoTRIgine (LAMICTAL) 25 MG tablet Take 25 mg by mouth daily.  0 11/14/2017 at Unknown time  . nicotine polacrilex (NICORETTE) 2 MG gum Take 1 each (2 mg total) by mouth as needed for smoking cessation. (Patient not taking: Reported on 11/15/2017) 100 tablet 0 Not Taking at Unknown time  . OLANZapine (ZYPREXA) 10 MG tablet Take 1 tablet (10 mg total) by mouth at bedtime. (Patient not taking: Reported on 11/15/2017) 30 tablet 0 Not Taking at Unknown time  . PARoxetine (PAXIL) 20 MG tablet Take 20 mg by mouth every morning.  0 11/14/2017 at Unknown time  .  predniSONE (DELTASONE) 20 MG tablet Take 2 tablets (40 mg total) by mouth daily. (Patient not taking: Reported on 11/15/2017) 10 tablet 0 Not Taking at Unknown time  . pregabalin (LYRICA) 75 MG capsule Take 75 mg by mouth 2 (two) times daily.   Past Week at Unknown time  . rosuvastatin (CRESTOR) 10 MG tablet Take 1 tablet (10 mg total) by mouth at bedtime. (Patient not taking: Reported on 01/01/2017) 30 tablet 0 Not Taking at Unknown time  . traZODone (DESYREL) 50 MG tablet Take 1 tablet (50 mg total) by mouth at bedtime and may repeat dose one time if needed. (Patient not taking: Reported on 11/15/2017) 30 tablet 0 Not Taking at Unknown time    Patient Stressors: Financial difficulties Health problems Medication change or  noncompliance Substance abuse  Patient Strengths: Active sense of humor Average or above average intelligence  Treatment Modalities: Medication Management, Group therapy, Case management,  1 to 1 session with clinician, Psychoeducation, Recreational therapy.   Physician Treatment Plan for Primary Diagnosis: Severe recurrent major depression without psychotic features ([place]) Long Term Goal(s): Improvement in symptoms so as ready for discharge Improvement in symptoms so as ready for discharge   Short Term Goals: Ability to identify and develop effective coping behaviors will improve Compliance with prescribed medications will improve Ability to identify triggers associated with substance abuse/mental health issues will improve Ability to identify changes in lifestyle to reduce recurrence of condition will improve Ability to verbalize feelings will improve Ability to disclose and discuss suicidal ideas  Medication Management: Evaluate patient's response, side effects, and tolerance of medication regimen.  Therapeutic Interventions: 1 to 1 sessions, Unit Group sessions and Medication administration.  Evaluation of Outcomes: Not Met  Physician Treatment Plan for Secondary Diagnosis: Principal Problem:   Severe recurrent major depression without psychotic features ([place])  Long Term Goal(s): Improvement in symptoms so as ready for discharge Improvement in symptoms so as ready for discharge   Short Term Goals: Ability to identify and develop effective coping behaviors will improve Compliance with prescribed medications will improve Ability to identify triggers associated with substance abuse/mental health issues will improve Ability to identify changes in lifestyle to reduce recurrence of condition will improve Ability to verbalize feelings will improve Ability to disclose and discuss suicidal ideas     Medication Management: Evaluate patient's response, side effects, and tolerance of  medication regimen.  Therapeutic Interventions: 1 to 1 sessions, Unit Group sessions and Medication administration.  Evaluation of Outcomes: Not Met   RN Treatment Plan for Primary Diagnosis: Severe recurrent major depression without psychotic features ([place]) Long Term Goal(s): Knowledge of disease and therapeutic regimen to maintain health will improve  Short Term Goals: Ability to remain free from injury will improve, Ability to verbalize frustration and anger appropriately will improve, Ability to demonstrate self-control, Ability to participate in decision making will improve, Ability to verbalize feelings will improve, Ability to disclose and discuss suicidal ideas, Ability to identify and develop effective coping behaviors will improve and Compliance with prescribed medications will improve  Medication Management: RN will administer medications as ordered by provider, will assess and evaluate patient's response and provide education to patient for prescribed medication. RN will report any adverse and/or side effects to prescribing provider.  Therapeutic Interventions: 1 on 1 counseling sessions, Psychoeducation, Medication administration, Evaluate responses to treatment, Monitor vital signs and CBGs as ordered, Perform/monitor CIWA, COWS, AIMS and Fall Risk screenings as ordered, Perform wound care treatments as ordered.  Evaluation of Outcomes: Not  Met   LCSW Treatment Plan for Primary Diagnosis: Severe recurrent major depression without psychotic features ([place]) Long Term Goal(s): Safe transition to appropriate next level of care at discharge, Engage patient in therapeutic group addressing interpersonal concerns.  Short Term Goals: Engage patient in aftercare planning with referrals and resources, Increase social support, Increase ability to appropriately verbalize feelings, Increase emotional regulation, Facilitate acceptance of mental health diagnosis and concerns, Facilitate patient  progression through stages of change regarding substance use diagnoses and concerns, Identify triggers associated with mental health/substance abuse issues and Increase skills for wellness and recovery  Therapeutic Interventions: Assess for all discharge needs, 1 to 1 time with Social worker, Explore available resources and support systems, Assess for adequacy in community support network, Educate family and significant other(s) on suicide prevention, Complete Psychosocial Assessment, Interpersonal group therapy.  Evaluation of Outcomes: Not Met   Progress in Treatment: Attending groups: Yes. Participating in groups: Yes. Taking medication as prescribed: Yes. Toleration medication: Yes. Family/Significant other contact made: No, will contact:  patient refused consent Patient understands diagnosis: Yes. Discussing patient identified problems/goals with staff: Yes. Medical problems stabilized or resolved: Yes. Denies suicidal/homicidal ideation: Yes. Issues/concerns per patient self-inventory: No. Other:   New problem(s) identified: None   New Short Term/Long Term Goal(s):Detox, medication stabilization, elimination of SI thoughts, development of comprehensive mental wellness plan.   Patient Goals: "I want to get my meds straight, go to [facility] for 30 days and then I'm moving back to [place] with my daughter"  Discharge Plan or Barriers: Patient plans to discharge to [facility] for substance abuse treatment. The patient plans to return to [place] once she complete the program at [facility]. CSW will provide resources for outpatient services in [place] once the patient specifies which part of [place] she and her daughter will be living in.   Reason for Continuation of Hospitalization: Anxiety Depression Medication stabilization  Estimated Length of Stay: Friday, 11/21/17  Attendees: Patient: Patricia Hall  11/17/2017 11:41 AM  Physician: Dr. Neita Garnet, MD 11/17/2017 11:41  AM  Nursing: Rise Paganini.Raliegh Ip, RN 11/17/2017 11:41 AM  RN Care Manager: Rhunette Croft 11/17/2017 11:41 AM  Social Worker: Radonna Ricker, [place] 11/17/2017 11:41 AM  Recreational Therapist: X 11/17/2017 11:41 AM  Other: X 11/17/2017 11:41 AM  Other: X 11/17/2017 11:41 AM  Other:X 11/17/2017 11:41 AM    Scribe for Treatment Team: Marylee Floras, [place] 11/17/2017 11:41 AM

## 2017-11-17 NOTE — Plan of Care (Signed)
Nurse discussed anxiety, depression, coping skills with patient. 

## 2017-11-18 DIAGNOSIS — R45851 Suicidal ideations: Secondary | ICD-10-CM

## 2017-11-18 MED ORDER — BUSPIRONE HCL 10 MG PO TABS: 10.0000 mg | ORAL_TABLET | Freq: Two times a day (BID) | ORAL | 0 refills | Status: AC

## 2017-11-18 MED ORDER — TRAZODONE HCL 50 MG PO TABS: 50.0000 mg | ORAL_TABLET | Freq: Every evening | ORAL | 0 refills | Status: AC | PRN

## 2017-11-18 MED ORDER — PREGABALIN 25 MG PO CAPS: 25.0000 mg | ORAL_CAPSULE | Freq: Two times a day (BID) | ORAL | 0 refills | Status: AC

## 2017-11-18 MED ORDER — CLOTRIMAZOLE 1 % EX CREA
TOPICAL_CREAM | Freq: Two times a day (BID) | CUTANEOUS | Status: DC
  Administered 2017-11-18 – 2017-11-20 (×3): via TOPICAL
  Filled 2017-11-18 (×2): qty 15

## 2017-11-18 MED ORDER — GUAIFENESIN ER 600 MG PO TB12
600.0000 mg | ORAL_TABLET | Freq: Two times a day (BID) | ORAL | Status: DC
  Administered 2017-11-18 – 2017-11-20 (×4): 600 mg via ORAL
  Filled 2017-11-18 (×9): qty 1

## 2017-11-18 MED ORDER — OLANZAPINE 5 MG PO TABS: 5.0000 mg | ORAL_TABLET | Freq: Every day | ORAL | 0 refills | Status: AC

## 2017-11-18 MED ORDER — HYDROXYZINE PAMOATE 50 MG PO CAPS: 50.0000 mg | ORAL_CAPSULE | Freq: Every day | ORAL | 0 refills | Status: AC

## 2017-11-18 MED ORDER — LAMOTRIGINE 25 MG PO TABS: 25.0000 mg | ORAL_TABLET | Freq: Every day | ORAL | 0 refills | Status: DC

## 2017-11-18 MED ORDER — LAMOTRIGINE 25 MG PO TABS: 25.0000 mg | ORAL_TABLET | Freq: Every day | ORAL | 0 refills | Status: AC

## 2017-11-18 NOTE — Progress Notes (Addendum)
  [facility] Adult Case Management Discharge Plan :  Will you be returning to the same living situation after discharge:  No. Pt is hoping to get into [facility] or [facility]. At discharge, do you have transportation home?: Yes,  taxi voucher and bus passes provided--PATIENT MUST DISCHARGE NO LATER THAN 7AM ON WED, 11/19/17 Do you have the ability to pay for your medications: Yes,  Medicare and Medicaid  Release of information consent forms completed and submitted to medical records by CSW.  Patient to Follow up at: [place], [facility] Follow up.   Why:  Please call Satira Sark to discuss possible admission into program. [phone]).  Contact information: [place] [address] [place] [address] [phone]        [facility] Follow up on 11/21/2017.   Specialty:  Behavioral Health Why:  Hospital follow-up on Friday, 11/21/17 at 8:00AM. Please bring Medicare/Medicaid cards and Picture ID. Thank you.  Contact information: [place] [address] [phone]        [facility], [facility] Follow up.   Why:  You have been placed on the waitlist. There are currently no screening appointments available. Please continue to check in with [facility] to check waitlist. Thank you.  Contact information: [address] [place] [address] [phone]           Next level of care provider has access to [place] and Suicide Prevention discussed: Yes,  SPE completed with pt; pt declined to consent to family contact. SPI pamphlet and Mobile Crisis information provided.  Have you used any form of tobacco in the last 30 days? (Cigarettes, Smokeless Tobacco, Cigars, and/or Pipes): Yes  Has patient been referred to the Quitline?: Patient refused referral  Patient has been referred for addiction treatment: Yes  [employer], LCSW 11/18/2017, 3:22 PM

## 2017-11-18 NOTE — Progress Notes (Addendum)
D:  Patient's self inventory sheet, patient sleeps good, sleep medication helpful.  Good appetite, normal energy level, good concentration.  Rated depression 3, denied hopeless and anxiety.  Denied withdrawals.  Denied physical problems.  Denied physical pain.  Pain medication helpful.  Goal is discharge to [facility].  Plans to focus.  Stated she needs medication for her bottom.  Does have discharge plans. A:  Medications administered per MD orders.  Emotional support and encouragement given patient. R:  Denied SI and HI, contracts for safety.  Denied A/V hallucinations.  Safety maintained with 15 minute checks. Patient is trying to get money from her family, daughter, cousin to buy ticket to [place] if she cannot get into [facility].  Patient stated she needs bus ticket to [place] or she will have to stay at [facility] until Friday.

## 2017-11-18 NOTE — Progress Notes (Signed)
Patient ID: Patricia Hall, female   DOB: [birth year], 52 y.o.   MRN: [id number]  Pt currently presents with an anxious affect and fidgety behavior. Pt reports to Probation officer that their goal is to "go to [place] and see a doctor there about my medicines." Reports her daughter is calling the bus company in the morning as she was able to collect money for her ticket tonight. Pt reports she may be ready for discharge by lunchtime. Pt states "I'm ready to get out of here, get out of town." Reports hopefulness of forming relationships with her grandhchildren in [place]. Pt reports good sleep with current medication regimen.   Pt provided with medications per providers orders. Pt's labs and vitals were monitored throughout the night. Pt given a 1:1 about emotional and mental status. Pt supported and encouraged to express concerns and questions. Pt educated on medications. AVS, medications, prescriptions reviewed with patient. Will pass onto oncoming RN.   Pt's safety ensured with 15 minute and environmental checks. Pt currently denies SI/HI and A/V hallucinations. Pt verbally agrees to seek staff if SI/HI or A/VH occurs and to consult with staff before acting on any harmful thoughts. Pt has concerns about not having her Lyrica sample and not having any luggage for travel, requests plastic discharge bags to use. Will continue POC.

## 2017-11-18 NOTE — Progress Notes (Signed)
[facility] MD Progress Note  11/18/2017 2:26 PM Patricia Hall  MRN:  [id number] Subjective: Patient is seen and examined.  Patient is a 52 year old female with a past psychiatric history significant for cocaine dependence as well as depression.  She is seen in follow-up.  The plan will be that she is going to attempt walking in [place] tomorrow for their residential program.  We have been informed that they have no beds available.  She stated she does have a back-up plan.  She denied any suicidal ideation.  She has some rash on her buttocks, and we are going to treat that, and she is also requesting Mucinex for her cough. Principal Problem: Severe recurrent major depression without psychotic features ([place]) Diagnosis:   Patient Active Problem List   Diagnosis Date Noted  . Severe recurrent major depression without psychotic features ([place]) [F33.2] 11/16/2017  . Substance induced mood disorder ([place]) [F19.94] 01/02/2017  . Bipolar disorder, current episode depressed, severe, without psychotic features ([place]) [F31.4] 02/04/2015  . Bipolar affective disorder, depressed ([place]) [F31.30] 02/04/2015  . Depression [F32.9]   . Suicidal ideation [R45.851]   . Paresthesia [R20.2] 09/01/2014  . Chronic daily headache [R51] 09/01/2014  . Acute cholecystitis [K81.0] 05/12/2012   Total Time spent with patient: 20 minutes  Past Psychiatric History: See admission H&P  Past Medical History:  Past Medical History:  Diagnosis Date  . Bipolar 1 disorder ([place])   . Cholecystitis 05/12/2012  . Chronic daily headache 09/01/2014  . Hep C w/ coma, chronic ([place])   . Hyperlipemia   . Obese   . Paresthesia 09/01/2014  . Substance abuse ([place])   . Substance abuse ([place])    cocaine  . Vitamin D deficiency     Past Surgical History:  Procedure Laterality Date  . APPENDECTOMY    . CHOLECYSTECTOMY  05/12/2012   Procedure: LAPAROSCOPIC CHOLECYSTECTOMY WITH INTRAOPERATIVE CHOLANGIOGRAM;  Surgeon: Ralene Ok, MD;   Location: [place];  Service: General;  Laterality: N/A;  . EYE SURGERY     strabismus surgery   Family History:  Family History  Problem Relation Age of Onset  . Heart disease Mother   . Diabetes Mother   . Diabetes Father   . Heart disease Father   . Diabetes Sister   . Diabetes Brother    Family Psychiatric  History: See admission H&P Social History:  Social History   Substance and Sexual Activity  Alcohol Use Yes   Comment: occasional     Social History   Substance and Sexual Activity  Drug Use Yes  . Types: "Crack" cocaine, Marijuana   Comment: daily cocaine use. as much as I can get.     Social History   Socioeconomic History  . Marital status: Legally Separated    Spouse name: Not on file  . Number of children: Not on file  . Years of education: Not on file  . Highest education level: Not on file  Occupational History  . Not on file  Social Needs  . Financial resource strain: Not on file  . Food insecurity:    Worry: Not on file    Inability: Not on file  . Transportation needs:    Medical: Not on file    Non-medical: Not on file  Tobacco Use  . Smoking status: Current Every Day Smoker    Packs/day: 1.00    Years: 25.00    Pack years: 25.00    Types: Cigarettes  . Smokeless tobacco: Never Used  Substance  and Sexual Activity  . Alcohol use: Yes    Comment: occasional  . Drug use: Yes    Types: "Crack" cocaine, Marijuana    Comment: daily cocaine use. as much as I can get.   Marland Kitchen Sexual activity: Yes    Birth control/protection: Condom  Lifestyle  . Physical activity:    Days per week: Not on file    Minutes per session: Not on file  . Stress: Not on file  Relationships  . Social connections:    Talks on phone: Not on file    Gets together: Not on file    Attends religious service: Not on file    Active member of club or organization: Not on file    Attends meetings of clubs or organizations: Not on file    Relationship status: Not on file   Other Topics Concern  . Not on file  Social History Narrative  . Not on file   Additional Social History:                         Sleep: Fair  Appetite:  Fair  Current Medications: Current Facility-Administered Medications  Medication Dose Route Frequency Provider Last Rate Last Dose  . albuterol (PROVENTIL HFA;VENTOLIN HFA) 108 (90 Base) MCG/ACT inhaler 2 puff  2 puff Inhalation Q6H PRN [name], [name], MD   2 puff at 11/18/17 0746  . alum & mag hydroxide-simeth (MAALOX/MYLANTA) 200-200-20 MG/5ML suspension 30 mL  30 mL Oral Q4H PRN Lindon Romp A, NP      . busPIRone (BUSPAR) tablet 10 mg  10 mg Oral BID Lindon Romp A, NP   10 mg at 11/18/17 0744  . guaiFENesin (MUCINEX) 12 hr tablet 600 mg  600 mg Oral BID Sharma Covert, MD      . hydrOXYzine (ATARAX/VISTARIL) tablet 25 mg  25 mg Oral TID PRN Rozetta Nunnery, NP   25 mg at 11/18/17 0749  . ibuprofen (ADVIL,MOTRIN) tablet 600 mg  600 mg Oral Q6H PRN Lindon Romp A, NP   600 mg at 11/18/17 0750  . lamoTRIgine (LAMICTAL) tablet 25 mg  25 mg Oral Daily [name], [name], MD   25 mg at 11/18/17 0744  . magnesium hydroxide (MILK OF MAGNESIA) suspension 30 mL  30 mL Oral Daily PRN Lindon Romp A, NP      . nicotine (NICODERM CQ - dosed in mg/24 hours) patch 21 mg  21 mg Transdermal Daily [name], [name], MD   21 mg at 11/18/17 0745  . OLANZapine (ZYPREXA) tablet 5 mg  5 mg Oral QHS [name], [name], MD   5 mg at 11/17/17 2131  . pregabalin (LYRICA) capsule 25 mg  25 mg Oral BID [name], [name], MD   25 mg at 11/18/17 0746  . traZODone (DESYREL) tablet 50 mg  50 mg Oral QHS PRN Lindon Romp A, NP   50 mg at 11/17/17 2131    Lab Results: No results found for this or any previous visit (from the past 48 hour(s)).  Blood Alcohol level:  Lab Results  Component Value Date   [facility] <10 11/15/2017   ETH <5 67/[birth year]1937    Metabolic Disorder Labs: Lab Results  Component Value Date   HGBA1C 5.9 (H) 11/16/2017   MPG  122.63 11/16/2017   MPG 131 02/09/2015   Lab Results  Component Value Date   PROLACTIN 14.8 01/02/2017   Lab Results  Component Value Date  CHOL 190 11/16/2017   TRIG 33 11/16/2017   HDL 68 11/16/2017   CHOLHDL 2.8 11/16/2017   VLDL 7 11/16/2017   LDLCALC 115 (H) 11/16/2017   LDLCALC 131 (H) 01/02/2017    Physical Findings: AIMS: Facial and Oral Movements Muscles of Facial Expression: None, normal Lips and Perioral Area: None, normal Jaw: None, normal Tongue: None, normal,Extremity Movements Upper (arms, wrists, hands, fingers): None, normal Lower (legs, knees, ankles, toes): None, normal, Trunk Movements Neck, shoulders, hips: None, normal, Overall Severity Severity of abnormal movements (highest score from questions above): None, normal Incapacitation due to abnormal movements: None, normal Patient's awareness of abnormal movements (rate only patient's report): No Awareness, Dental Status Current problems with teeth and/or dentures?: No Does patient usually wear dentures?: No  CIWA:  CIWA-Ar Total: 1 COWS:  COWS Total Score: 1  Musculoskeletal: Strength & Muscle Tone: within normal limits Gait & Station: normal Patient leans: N/A  Psychiatric Specialty Exam: Physical Exam  Nursing note and vitals reviewed. Constitutional: She is oriented to person, place, and time. She appears well-developed and well-nourished.  HENT:  Head: Normocephalic and atraumatic.  Respiratory: Effort normal.  Musculoskeletal: Normal range of motion.  Neurological: She is alert and oriented to person, place, and time.    ROS  Blood pressure (!) 108/57, pulse 89, temperature 98.7 F (37.1 C), temperature source Oral, resp. rate 18, height 5' 4.5" (1.638 m), weight 99.3 kg (219 lb).Body mass index is 37.01 kg/m.  General Appearance: Casual  Eye Contact:  Fair  Speech:  Normal Rate  Volume:  Normal  Mood:  Euthymic  Affect:  Appropriate  Thought Process:  Coherent  Orientation:   Full (Time, Place, and Person)  Thought Content:  Logical  Suicidal Thoughts:  No  Homicidal Thoughts:  No  Memory:  Immediate;   Fair  Judgement:  Intact  Insight:  Fair  Psychomotor Activity:  Normal  Concentration:  Concentration: Fair  Recall:  [employer] of Knowledge:  Fair  Language:  Fair  Akathisia:  Negative  Handed:  Right  AIMS (if indicated):     Assets:  Communication Skills Desire for Improvement  ADL's:  Intact  Cognition:  WNL  Sleep:  Number of Hours: 6     Treatment Plan Summary: Daily contact with patient to assess and evaluate symptoms and progress in treatment, Medication management and Plan Patient is seen and examined.  Patient is a 52 year old female with the above-stated past psychiatric history who is seen in follow-up.  The plan is for discharge tomorrow.  She will self present at [facility] for substance abuse treatment.  She stated she does have a back-up plan.  I am not going to change any of her psychiatric medications, but she is requesting Mucinex.  I will start that today.  And as well I am going to put some clotrimazole on that rash.  Sharma Covert, MD 11/18/2017, 2:26 PM

## 2017-11-18 NOTE — Progress Notes (Signed)
Patient's R buttock has small round raised area size of dime, no drainage, no itching.  Patient stated she noticed the area last night.  Patient has been resting in bed.   Respirations even and unlabored.  No signs/symptoms of pain/distress noted on patient's face/body movements  Safety maintained with 15 minute checks.

## 2017-11-18 NOTE — Progress Notes (Signed)
Adult Psychoeducational Group Note  Date:  11/18/2017 Time:  4:37 PM  Group Topic/Focus:  Self Care:   The focus of this group is to help patients understand the importance of self-care in order to improve or restore emotional, physical, spiritual, interpersonal, and financial health.  Participation Level:  Active  Participation Quality:  Appropriate  Affect:  Appropriate  Cognitive:  Appropriate  Insight: Appropriate  Engagement in Group:  Engaged  Modes of Intervention:  Discussion  Additional Comments:  Pt attended the group[ and was engaged and on topic. Pt gave a examples about self care and advise for others to be able to do that for themselves.   Patricia Hall 11/18/2017, 4:37 PM

## 2017-11-18 NOTE — BHH Suicide Risk Assessment (Signed)
[facility] Discharge Suicide Risk Assessment   Principal Problem: Severe recurrent major depression without psychotic features [facility]) Discharge Diagnoses:  Patient Active Problem List   Diagnosis Date Noted  . Severe recurrent major depression without psychotic features ([place]) [F33.2] 11/16/2017  . Substance induced mood disorder ([place]) [F19.94] 01/02/2017  . Bipolar disorder, current episode depressed, severe, without psychotic features ([place]) [F31.4] 02/04/2015  . Bipolar affective disorder, depressed ([place]) [F31.30] 02/04/2015  . Depression [F32.9]   . Suicidal ideation [R45.851]   . Paresthesia [R20.2] 09/01/2014  . Chronic daily headache [R51] 09/01/2014  . Acute cholecystitis [K81.0] 05/12/2012    Total Time spent with patient: 30 minutes  Musculoskeletal: Strength & Muscle Tone: within normal limits Gait & Station: normal Patient leans: N/A  Psychiatric Specialty Exam: Review of Systems  All other systems reviewed and are negative.   Blood pressure (!) 108/57, pulse 89, temperature 98.7 F (37.1 C), temperature source Oral, resp. rate 18, height 5' 4.5" (1.638 m), weight 99.3 kg (219 lb).Body mass index is 37.01 kg/m.  General Appearance: Casual  Eye Contact::  Fair  Speech:  Normal Rate409  Volume:  Normal  Mood:  Euthymic  Affect:  Congruent  Thought Process:  Coherent  Orientation:  Full (Time, Place, and Person)  Thought Content:  Logical  Suicidal Thoughts:  No  Homicidal Thoughts:  No  Memory:  Immediate;   Fair  Judgement:  Intact  Insight:  Fair  Psychomotor Activity:  Normal  Concentration:  Good  Recall:  [employer] of Knowledge:Fair  Language: Fair  Akathisia:  Negative  Handed:  Right  AIMS (if indicated):     Assets:  Communication Skills Desire for Improvement Social Support Transportation  Sleep:  Number of Hours: 6  Cognition: WNL  ADL's:  Intact   Mental Status Per Nursing Assessment::   On Admission:     Demographic Factors:  Divorced or  widowed, Caucasian and Low socioeconomic status  Loss Factors: NA  Historical Factors: Prior suicide attempts and Impulsivity  Risk Reduction Factors:   Sense of responsibility to family and Positive social support  Continued Clinical Symptoms:  Alcohol/Substance Abuse/Dependencies  Cognitive Features That Contribute To Risk:  None    Suicide Risk:  Minimal: No identifiable suicidal ideation.  Patients presenting with no risk factors but with morbid ruminations; may be classified as minimal risk based on the severity of the depressive symptoms  Pat[facility], [facility] Follow up.   Why:  Please call Satira Sark to discuss possible admission into program. [phone]).  Contact information: [place] [address] [place] [address] [phone]        [facility] Follow up on 11/21/2017.   Specialty:  Behavioral Health Why:  Hospital follow-up on Friday, 11/21/17 at 8:00AM. Please bring Medicare/Medicaid cards and Picture ID. Thank you.  Contact information: [place] [address] [phone]        [facility] Follow up.   Why:  You have been placed on the waitlist. There are currently no screening appointments available. Please continue to check in with [facility] to check waitlist. Thank you.  Contact information: [address] [place] [address] [phone]           Plan Of Care/Follow-up recommendations:  Activity:  ad lib  Sharma Covert, MD 11/18/2017, 4:58 PM

## 2017-11-18 NOTE — BHH Group Notes (Signed)
[place] Group Notes:  (Nursing/MHT/Case Management/Adjunct)  Date:  11/18/2017  Time:  3:15 pm  Type of Therapy:  Psychoeducational Skills  Participation Level:  Did Not Attend  Participation Quality:    Affect:    Cognitive:    Insight:    Engagement in Group:    Modes of Intervention:    Summary of Progress/Problems:  Patricia Hall 11/18/2017, 4:17 PM
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
## 2017-11-18 NOTE — Progress Notes (Signed)
CSW and pt met to discuss discharge/aftercare planning. Pt has been informed multiple times that [facility] has a waitlist due to staffing issues and she does not have a bed available. Pt is choosing to discharge tomorrow morning to walk-in for possible screening despite being informed that she will likely not get admitted due to their low staffing. Pt was also provided with [facility] information and encouraged to continue calling [facility] in attempt to complete interview-this program can offer patient housing and substance abuse treatment. [facility] appt also made for pt and she was provided with AA/NA information and [place] pamphlet for additional community support. Pt is requesting to discharge tomorrow morning. Treatment team notified of above.  Maxie Better, MSW, LCSW Clinical Social Worker 11/18/2017 3:26 PM

## 2017-11-18 NOTE — Progress Notes (Signed)
Recreation Therapy Notes  Animal-Assisted Activity (AAA) Program Checklist/Progress Notes Patient Eligibility Criteria Checklist & Daily Group note for Rec Tx Intervention  Date: 5.7.19 Time: 5868 Location: [address]   AAA/T Program Assumption of Risk Form signed by Teacher, music or Parent Legal Guardian YES   Patient is free of allergies or sever asthma YES   Patient reports no fear of animals YES   Patient reports no history of cruelty to animals YES   Patient understands his/her participation is voluntary YES   Patient washes hands before animal contact YES   Patient washes hands after animal contact YES   Behavioral Response: Engaged  Education: Contractor, Appropriate Animal Interaction   Education Outcome: Acknowledges understanding/In group clarification offered/Needs additional education.   Clinical Observations/Feedback: Pt attended group.    Victorino Sparrow, LRT/CTRS         Victorino Sparrow A 11/18/2017 3:49 PM

## 2017-11-19 DIAGNOSIS — M797 Fibromyalgia: Secondary | ICD-10-CM

## 2017-11-19 DIAGNOSIS — F1420 Cocaine dependence, uncomplicated: Secondary | ICD-10-CM

## 2017-11-19 DIAGNOSIS — F1210 Cannabis abuse, uncomplicated: Secondary | ICD-10-CM

## 2017-11-19 NOTE — Progress Notes (Signed)
Patient ID: Patricia Hall, female   DOB: 02/22/1966, 52 y.o.   MRN: [id number]  Pt currently presents with a anxious affect and anxious behavior. Pt reports to Probation officer that their goal is to "go to [place] tomorrow." Pt states "my daughter has the ticket waiting for me at the bus stop." Pt reports good sleep with current medication regimen.   Pt provided with medications per providers orders. Pt's labs and vitals were monitored throughout the night. Pt given a 1:1 about emotional and mental status. Pt supported and encouraged to express concerns and questions. Pt educated on medications.   Pt's safety ensured with 15 minute and environmental checks. Pt currently denies SI/HI and A/V hallucinations. Pt verbally agrees to seek staff if SI/HI or A/VH occurs and to consult with staff before acting on any harmful thoughts. Will continue POC.

## 2017-11-19 NOTE — Progress Notes (Signed)
D: Patricia Hall is disappointed that her bus ticket to [place] is turning out to be more expensive than anticipated, which is delaying her discharge until tomorrow. She denied SI, HI, and AVH. She denied feeling depressed, but rated her feelings of hopelessness 4/10 and anxiety of 8/10. She's participated in groups, recreation, and posed no behavioral problems on the unit.   A: Meds given as ordered, including requested PRN albuterol inhaler. Q15 safety checks maintained. Support/encouragement offered.  R: Pt remains free from harm and continues with treatment. Will continue to monitor for needs/safety.

## 2017-11-19 NOTE — Progress Notes (Signed)
Patient did attend the evening speaker NA meeting.  

## 2017-11-19 NOTE — Progress Notes (Signed)
Recreation Therapy Notes  Date: 5.8.19 Time: 0930 Location: [address] Day[address]  Group Topic: Stress Management  Goal Area(s) Addresses:  Patient will verbalize importance of using healthy stress management.  Patient will identify positive emotions associated with healthy stress management.   Behavioral Response: Engaged  Intervention: Stress Management  Activity :  Meditation.  LRT introduced the stress management technique of meditation.  LRT played a meditation that focused on the strength and resilience of mountains and how those characteristics can be used in daily life.  Patients were to follow along as meditation played.  Education:  Stress Management, Discharge Planning.   Education Outcome: Acknowledges edcuation/In group clarification offered/Needs additional education  Clinical Observations/Feedback: Pt attended and participated in group.      Zeniah Briney Linday, LRT/CTRS         Victorino Sparrow A 11/19/2017 11:37 AM

## 2017-11-19 NOTE — [facility] Group Notes (Signed)
LCSW Group Therapy Note 11/19/2017 12:58 PM  Type of Therapy/Topic: Group Therapy: Emotion Regulation  Participation Level: Did Not Attend   Description of Group:  The purpose of this group is to assist patients in learning to regulate negative emotions and experience positive emotions. Patients will be guided to discuss ways in which they have been vulnerable to their negative emotions. These vulnerabilities will be juxtaposed with experiences of positive emotions or situations, and patients will be challenged to use positive emotions to combat negative ones. Special emphasis will be placed on coping with negative emotions in conflict situations, and patients will process healthy conflict resolution skills.  Therapeutic Goals: 1. Patient will identify two positive emotions or experiences to reflect on in order to balance out negative emotions 2. Patient will label two or more emotions that they find the most difficult to experience 3. Patient will demonstrate positive conflict resolution skills through discussion and/or role plays  Summary of Patient Progress:  Invited, chose not to attend.    Therapeutic Modalities:  Cognitive Behavioral Therapy Feelings Identification Dialectical Behavioral Therapy   Theresa Duty Clinical Social Worker

## 2017-11-19 NOTE — Progress Notes (Signed)
[facility] MD Progress Note  11/19/2017 2:40 PM Patricia Hall  MRN:  [id number] Subjective:  "I'm feeling great!"  She finally got her bus ticket to [place] to live with her daughter, some anxiety due to "I didn't raise her, seen her 3 times in the past 3 years."  She is ready to get off crack and get her life together.  She was scheduled to discharge today but due to her ticket issue, will discharge tomorrow.  Denies depression, withdrawal symptoms, and suicidal ideations.  Very engaging and talkative, discusses her "fibromalgebra." Principal Problem: Severe recurrent major depression without psychotic features ([place]) Diagnosis:   Patient Active Problem List   Diagnosis Date Noted  . Severe recurrent major depression without psychotic features ([place]) [F33.2] 11/16/2017    Priority: High  . Substance induced mood disorder ([place]) [F19.94] 01/02/2017  . Bipolar disorder, current episode depressed, severe, without psychotic features ([place]) [F31.4] 02/04/2015  . Bipolar affective disorder, depressed ([place]) [F31.30] 02/04/2015  . Depression [F32.9]   . Suicidal ideation [R45.851]   . Paresthesia [R20.2] 09/01/2014  . Chronic daily headache [R51] 09/01/2014  . Acute cholecystitis [K81.0] 05/12/2012   Total Time spent with patient: 30 minutes  Past Psychiatric History: depression, substance abuse  Past Medical History:  Past Medical History:  Diagnosis Date  . Bipolar 1 disorder ([place])   . Cholecystitis 05/12/2012  . Chronic daily headache 09/01/2014  . Hep C w/ coma, chronic ([place])   . Hyperlipemia   . Obese   . Paresthesia 09/01/2014  . Substance abuse ([place])   . Substance abuse ([place])    cocaine  . Vitamin D deficiency     Past Surgical History:  Procedure Laterality Date  . APPENDECTOMY    . CHOLECYSTECTOMY  05/12/2012   Procedure: LAPAROSCOPIC CHOLECYSTECTOMY WITH INTRAOPERATIVE CHOLANGIOGRAM;  Surgeon: Ralene Ok, MD;  Location: [facility];  Service: General;  Laterality: N/A;  . EYE SURGERY      strabismus surgery   Family History:  Family History  Problem Relation Age of Onset  . Heart disease Mother   . Diabetes Mother   . Diabetes Father   . Heart disease Father   . Diabetes Sister   . Diabetes Brother    Family Psychiatric  History: sibling with substance abuse Social History:  Social History   Substance and Sexual Activity  Alcohol Use Yes   Comment: occasional     Social History   Substance and Sexual Activity  Drug Use Yes  . Types: "Crack" cocaine, Marijuana   Comment: daily cocaine use. as much as I can get.     Social History   Socioeconomic History  . Marital status: Legally Separated    Spouse name: Not on file  . Number of children: Not on file  . Years of education: Not on file  . Highest education level: Not on file  Occupational History  . Not on file  Social Needs  . Financial resource strain: Not on file  . Food insecurity:    Worry: Not on file    Inability: Not on file  . Transportation needs:    Medical: Not on file    Non-medical: Not on file  Tobacco Use  . Smoking status: Current Every Day Smoker    Packs/day: 1.00    Years: 25.00    Pack years: 25.00    Types: Cigarettes  . Smokeless tobacco: Never Used  Substance and Sexual Activity  . Alcohol use: Yes    Comment: occasional  .  Drug use: Yes    Types: "Crack" cocaine, Marijuana    Comment: daily cocaine use. as much as I can get.   Marland Kitchen Sexual activity: Yes    Birth control/protection: Condom  Lifestyle  . Physical activity:    Days per week: Not on file    Minutes per session: Not on file  . Stress: Not on file  Relationships  . Social connections:    Talks on phone: Not on file    Gets together: Not on file    Attends religious service: Not on file    Active member of club or organization: Not on file    Attends meetings of clubs or organizations: Not on file    Relationship status: Not on file  Other Topics Concern  . Not on file  Social History  Narrative  . Not on file   Additional Social History:       Sleep: Good  Appetite:  Good  Current Medications: Current Facility-Administered Medications  Medication Dose Route Frequency Provider Last Rate Last Dose  . albuterol (PROVENTIL HFA;VENTOLIN HFA) 108 (90 Base) MCG/ACT inhaler 2 puff  2 puff Inhalation Q6H PRN [name], [name], MD   2 puff at 11/19/17 1325  . alum & mag hydroxide-simeth (MAALOX/MYLANTA) 200-200-20 MG/5ML suspension 30 mL  30 mL Oral Q4H PRN [place] Romp A, NP      . busPIRone (BUSPAR) tablet 10 mg  10 mg Oral BID [place] Romp A, NP   10 mg at 11/19/17 0756  . clotrimazole (LOTRIMIN) 1 % cream   Topical BID Sharma Covert, MD      . guaiFENesin [facility]) 12 hr tablet 600 mg  600 mg Oral BID Sharma Covert, MD   600 mg at 11/19/17 0756  . hydrOXYzine (ATARAX/VISTARIL) tablet 25 mg  25 mg Oral TID PRN Rozetta Nunnery, NP   25 mg at 11/18/17 2324  . ibuprofen (ADVIL,MOTRIN) tablet 600 mg  600 mg Oral Q6H PRN [place] Romp A, NP   600 mg at 11/18/17 0750  . lamoTRIgine (LAMICTAL) tablet 25 mg  25 mg Oral Daily [name], [name], MD   25 mg at 11/19/17 0756  . magnesium hydroxide (MILK OF MAGNESIA) suspension 30 mL  30 mL Oral Daily PRN [place] Romp A, NP      . nicotine (NICODERM CQ - dosed in mg/24 hours) patch 21 mg  21 mg Transdermal Daily [name], [name], MD   21 mg at 11/19/17 0800  . OLANZapine (ZYPREXA) tablet 5 mg  5 mg Oral QHS [name], [name], MD   5 mg at 11/18/17 2136  . pregabalin (LYRICA) capsule 25 mg  25 mg Oral BID [name], [name], MD   25 mg at 11/19/17 0756  . traZODone (DESYREL) tablet 50 mg  50 mg Oral QHS PRN [place] Romp A, NP   50 mg at 11/18/17 2136    Lab Results: No results found for this or any previous visit (from the past 48 hour(s)).  Blood Alcohol level:  Lab Results  Component Value Date   [facility] <10 11/15/2017   ETH <5 16/04/9603    Metabolic Disorder Labs: Lab Results  Component Value Date   HGBA1C 5.9 (H)  11/16/2017   MPG 122.63 11/16/2017   MPG 131 02/09/2015   Lab Results  Component Value Date   PROLACTIN 14.8 01/02/2017   Lab Results  Component Value Date   CHOL 190 11/16/2017   TRIG 33 11/16/2017  HDL 68 11/16/2017   CHOLHDL 2.8 11/16/2017   VLDL 7 11/16/2017   LDLCALC 115 (H) 11/16/2017   LDLCALC 131 (H) 01/02/2017    Physical Findings: AIMS: Facial and Oral Movements Muscles of Facial Expression: None, normal Lips and Perioral Area: None, normal Jaw: None, normal Tongue: None, normal,Extremity Movements Upper (arms, wrists, hands, fingers): None, normal Lower (legs, knees, ankles, toes): None, normal, Trunk Movements Neck, shoulders, hips: None, normal, Overall Severity Severity of abnormal movements (highest score from questions above): None, normal Incapacitation due to abnormal movements: None, normal Patient's awareness of abnormal movements (rate only patient's report): No Awareness, Dental Status Current problems with teeth and/or dentures?: No Does patient usually wear dentures?: No  CIWA:  CIWA-Ar Total: 1 COWS:  COWS Total Score: 2  Musculoskeletal: Strength & Muscle Tone: within normal limits Gait & Station: normal Patient leans: N/A  Psychiatric Specialty Exam: Physical Exam  Constitutional: She is oriented to person, place, and time. She appears well-developed and well-nourished.  HENT:  Head: Normocephalic.  Neck: Normal range of motion.  Respiratory: Effort normal.  Musculoskeletal: Normal range of motion.  Neurological: She is alert and oriented to person, place, and time.  Psychiatric: She has a normal mood and affect. Her speech is normal and behavior is normal. Judgment and thought content normal. Cognition and memory are normal.    Review of Systems  Psychiatric/Behavioral: Positive for substance abuse.  All other systems reviewed and are negative.   Blood pressure 126/69, pulse 80, temperature (!) 97.5 F (36.4 C), temperature source  Oral, resp. rate 16, height 5' 4.5" (1.638 m), weight 99.3 kg (219 lb).Body mass index is 37.01 kg/m.  General Appearance: Casual  Eye Contact:  Good  Speech:  Normal Rate  Volume:  Normal  Mood:  Euthymic  Affect:  Congruent  Thought Process:  Coherent and Descriptions of Associations: Intact  Orientation:  Full (Time, Place, and Person)  Thought Content:  WDL and Logical  Suicidal Thoughts:  No  Homicidal Thoughts:  No  Memory:  Immediate;   Fair Recent;   Fair Remote;   Fair  Judgement:  Fair  Insight:  Fair  Psychomotor Activity:  Normal  Concentration:  Concentration: Good and Attention Span: Good  Recall:  Good  Fund of Knowledge:  Fair  Language:  Good  Akathisia:  No  Handed:  Right  AIMS (if indicated):     Assets:  Housing Leisure Time Physical Health Resilience Social Support  ADL's:  Intact  Cognition:  WNL  Sleep:  Number of Hours: 3    Treatment Plan Summary: Major depressive disorder, recurrent, severe without psychosis: -Continue Lamictal 25 mg daily for mood stabilization -Continue Zyprexa 5 mg at bedtime for mood stabilization  Insomnia -Continue Trazodone 50 mg at bedtime PRN sleep  Anxiety -Continue Buspar 10 mg BID foTID anxiety -Continue hydoxyzine 25 mg TID PRN anxiety  -Continue group and individual therapy -Continue discharge plans  Waylan Boga, NP 11/19/2017, 2:40 PM

## 2017-11-19 NOTE — Progress Notes (Signed)
Pt attended goals group today and participated. Pt goal is to go home asap

## 2017-11-19 NOTE — Progress Notes (Addendum)
  [facility] Adult Case Management Discharge Plan :  Will you be returning to the same living situation after discharge:  No. Pt is hoping to get into [facility] or [facility]. At discharge, do you have transportation home?: bus pass provided--PT DISCHARGE RESCHEDULED FOR Thursday, 11/20/17 Do you have the ability to pay for your medications: Yes,  Medicare and Medicaid  Release of information consent forms completed and submitted to medical records by CSW.  Patient to Follow up at: Follow-up Information    [facility] Follow up on 11/21/2017.   Specialty:  Behavioral Health Why:  Hospital follow-up on Friday, 11/21/17 at 8:00AM. Please bring Medicare/Medicaid cards and Picture ID. Thank you.  Contact information: [address] [place] [id number] [phone]        Services[facility] Follow up.   Why:  You have been placed on the waitlist. There are currently no screening appointments available. Please continue to check in with [facility] to check waitlist. Thank you.  Contact information: [address] [place] [id number] [phone]           Next level of care provider has access to [place] and Suicide Prevention discussed: Yes,  SPE completed with pt; pt declined to consent to family contact. SPI pamphlet and Mobile Crisis information provided.  Have you used any form of tobacco in the last 30 days? (Cigarettes, Smokeless Tobacco, Cigars, and/or Pipes): Yes  Has patient been referred to the Quitline?: Patient refused referral  Patient has been referred for addiction treatment: Yes  [employer], LCSW 11/20/2017, 12:48 PM

## 2017-11-20 NOTE — [facility] Group Notes (Signed)
Adult Psychoeducational Group Note  Date:  11/20/2017 Time:  9:24 AM  Group Topic/Focus:  Goals Group:   The focus of this group is to help patients establish daily goals to achieve during treatment and discuss how the patient can incorporate goal setting into their daily lives to aide in recovery.  Participation Level:  Active  Participation Quality:  Appropriate  Affect:  Appropriate  Cognitive:  Alert  Insight: Appropriate  Engagement in Group:  Engaged  Modes of Intervention:  Orientation  Additional Comments:  Pt attended and participated in orientation/goals group. Pt goal for today is to find new surroundings once she's discharged.  Huel Cote 11/20/2017, 9:24 AM

## 2017-11-20 NOTE — Progress Notes (Signed)
Discharge Note:  Patient discharged with taxi voucher.    Patient denied SI and HI.  Denied A/V hallucinations.  Suicide prevention information given and discussed with patient who stated she understood and had no questions.  Patient stated she received all her belongings, clothing, toiletries, misc items, prescriptions, medication, etc.  Patient stated she appreciated all assistance received from [facility] staff.  All required discharge information given to patient at discharge.

## 2017-11-20 NOTE — Progress Notes (Signed)
D:  Patient's self inventory sheet, patient sleeps good, sleep medication helpful.  Good appetite, hyper energy level, good concentration.  Denied depression, hopeless.  Denied withdrawals.  Denied SI.  Denied physical problems.  Denied physical pain.  Pain medication helpful.  Goal is discharge.  Plans to stay focused.  "Thanks for your help.  I love each and every one of you." A:  Medications administered per MD orders.  Emotional support and encouragement given patient. R:  Denied SI and HI, contracts for safety.  Denied A/V hallucinations.  Safety maintained with 15 minute checks. Patient excited about discharge and going to live with her daughter in [place], plans to go to rehab also.
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
## 2017-11-20 NOTE — BHH Suicide Risk Assessment (Signed)
[facility] Discharge Suicide Risk Assessment   Principal Problem: Severe recurrent major depression without psychotic features [facility]) Discharge Diagnoses:  Patient Active Problem List   Diagnosis Date Noted  . Severe recurrent major depression without psychotic features ([place]) [F33.2] 11/16/2017  . Substance induced mood disorder ([place]) [F19.94] 01/02/2017  . Bipolar disorder, current episode depressed, severe, without psychotic features ([place]) [F31.4] 02/04/2015  . Bipolar affective disorder, depressed ([place]) [F31.30] 02/04/2015  . Depression [F32.9]   . Suicidal ideation [R45.851]   . Paresthesia [R20.2] 09/01/2014  . Chronic daily headache [R51] 09/01/2014  . Acute cholecystitis [K81.0] 05/12/2012    Total Time spent with patient: 30 minutes  Musculoskeletal: Strength & Muscle Tone: within normal limits Gait & Station: normal Patient leans: N/A  Psychiatric Specialty Exam: Review of Systems  All other systems reviewed and are negative.   Blood pressure 126/69, pulse 80, temperature (!) 97.5 F (36.4 C), temperature source Oral, resp. rate 16, height 5' 4.5" (1.638 m), weight 99.3 kg (219 lb).Body mass index is 37.01 kg/m.  General Appearance: Casual  Eye Contact::  Good  Speech:  Clear and Coherent409  Volume:  Normal  Mood:  Euthymic  Affect:  Congruent  Thought Process:  Coherent  Orientation:  Full (Time, Place, and Person)  Thought Content:  Logical  Suicidal Thoughts:  No  Homicidal Thoughts:  No  Memory:  Immediate;   Fair  Judgement:  Fair  Insight:  Fair  Psychomotor Activity:  Normal  Concentration:  Fair  Recall:  [employer] of Knowledge:Good  Language: Good  Akathisia:  Negative  Handed:  Right  AIMS (if indicated):     Assets:  Desire for Improvement  Sleep:  Number of Hours: 5.75  Cognition: WNL  ADL's:  Intact   Mental Status Per Nursing Assessment::   On Admission:     Demographic Factors:  Adolescent or young adult and Caucasian  Loss  Factors: NA  Historical Factors: Impulsivity  Risk Reduction Factors:   Sense of responsibility to family  Continued Clinical Symptoms:  Alcohol/Substance Abuse/Dependencies  Cognitive Features That Contribute To Risk:  None    Suicide Risk:  Minimal: No identifiable suicidal ideation.  Patients presenting with no risk factors but with morbid ruminations; may be classified as minimal risk based on the severity of the depressive symptoms  [place] Follow up.   Why:  Please call Satira Sark to discuss possible admission into program. [phone]).  Contact information: [place] [address] [place] [address] [phone]        [facility] Follow up on 11/21/2017.   Specialty:  Behavioral Health Why:  Hospital follow-up on Friday, 11/21/17 at 8:00AM. Please bring Medicare/Medicaid cards and Picture ID. Thank you.  Contact information: [place] [address] [phone]        [facility] Follow up.   Why:  You have been placed on the waitlist. There are currently no screening appointments available. Please continue to check in with [facility] to check waitlist. Thank you.  Contact information: [place] [address] [phone]           Plan Of Care/Follow-up recommendations:  Activity:  ad lib  Sharma Covert, MD 11/20/2017, 7:51 AM

## 2017-12-20 DIAGNOSIS — Z1509 Genetic susceptibility to other malignant neoplasm: Secondary | ICD-10-CM | POA: Diagnosis not present

## 2017-12-20 DIAGNOSIS — Z1379 Encounter for other screening for genetic and chromosomal anomalies: Secondary | ICD-10-CM | POA: Diagnosis not present

## 2017-12-20 DIAGNOSIS — Z806 Family history of leukemia: Secondary | ICD-10-CM | POA: Diagnosis not present

## 2018-01-26 DIAGNOSIS — M797 Fibromyalgia: Secondary | ICD-10-CM | POA: Diagnosis not present

## 2018-01-26 DIAGNOSIS — B1920 Unspecified viral hepatitis C without hepatic coma: Secondary | ICD-10-CM | POA: Diagnosis not present

## 2018-02-03 DIAGNOSIS — B1920 Unspecified viral hepatitis C without hepatic coma: Secondary | ICD-10-CM | POA: Diagnosis not present

## 2018-02-03 DIAGNOSIS — Z7251 High risk heterosexual behavior: Secondary | ICD-10-CM | POA: Diagnosis not present

## 2018-02-03 DIAGNOSIS — D649 Anemia, unspecified: Secondary | ICD-10-CM | POA: Diagnosis not present

## 2018-02-03 DIAGNOSIS — M797 Fibromyalgia: Secondary | ICD-10-CM | POA: Diagnosis not present

## 2018-02-03 DIAGNOSIS — F3162 Bipolar disorder, current episode mixed, moderate: Secondary | ICD-10-CM | POA: Diagnosis not present

## 2018-02-03 DIAGNOSIS — E559 Vitamin D deficiency, unspecified: Secondary | ICD-10-CM | POA: Diagnosis not present

## 2018-02-17 DIAGNOSIS — F3162 Bipolar disorder, current episode mixed, moderate: Secondary | ICD-10-CM | POA: Diagnosis not present

## 2018-02-17 DIAGNOSIS — R768 Other specified abnormal immunological findings in serum: Secondary | ICD-10-CM | POA: Diagnosis not present

## 2018-02-17 DIAGNOSIS — B1920 Unspecified viral hepatitis C without hepatic coma: Secondary | ICD-10-CM | POA: Diagnosis not present

## 2018-02-17 DIAGNOSIS — M79672 Pain in left foot: Secondary | ICD-10-CM | POA: Diagnosis not present

## 2019-04-13 DIAGNOSIS — Z6841 Body Mass Index (BMI) 40.0 and over, adult: Secondary | ICD-10-CM | POA: Diagnosis not present

## 2019-04-13 DIAGNOSIS — F418 Other specified anxiety disorders: Secondary | ICD-10-CM | POA: Diagnosis not present

## 2019-04-13 DIAGNOSIS — Z1211 Encounter for screening for malignant neoplasm of colon: Secondary | ICD-10-CM | POA: Diagnosis not present

## 2019-04-13 DIAGNOSIS — E559 Vitamin D deficiency, unspecified: Secondary | ICD-10-CM | POA: Diagnosis not present

## 2019-04-13 DIAGNOSIS — N390 Urinary tract infection, site not specified: Secondary | ICD-10-CM | POA: Diagnosis not present

## 2019-04-13 DIAGNOSIS — Z13220 Encounter for screening for lipoid disorders: Secondary | ICD-10-CM | POA: Diagnosis not present

## 2019-04-13 DIAGNOSIS — Z131 Encounter for screening for diabetes mellitus: Secondary | ICD-10-CM | POA: Diagnosis not present

## 2019-04-13 DIAGNOSIS — B1920 Unspecified viral hepatitis C without hepatic coma: Secondary | ICD-10-CM | POA: Diagnosis not present

## 2019-04-13 DIAGNOSIS — F319 Bipolar disorder, unspecified: Secondary | ICD-10-CM | POA: Diagnosis not present

## 2019-04-13 DIAGNOSIS — M797 Fibromyalgia: Secondary | ICD-10-CM | POA: Diagnosis not present

## 2019-04-13 DIAGNOSIS — K219 Gastro-esophageal reflux disease without esophagitis: Secondary | ICD-10-CM | POA: Diagnosis not present

## 2019-04-13 DIAGNOSIS — F17210 Nicotine dependence, cigarettes, uncomplicated: Secondary | ICD-10-CM | POA: Diagnosis not present

## 2019-04-14 DIAGNOSIS — F319 Bipolar disorder, unspecified: Secondary | ICD-10-CM | POA: Diagnosis not present

## 2019-04-14 DIAGNOSIS — F418 Other specified anxiety disorders: Secondary | ICD-10-CM | POA: Diagnosis not present

## 2019-04-14 DIAGNOSIS — K219 Gastro-esophageal reflux disease without esophagitis: Secondary | ICD-10-CM | POA: Diagnosis not present

## 2019-04-14 DIAGNOSIS — M797 Fibromyalgia: Secondary | ICD-10-CM | POA: Diagnosis not present

## 2019-04-14 DIAGNOSIS — Z13220 Encounter for screening for lipoid disorders: Secondary | ICD-10-CM | POA: Diagnosis not present

## 2019-04-14 DIAGNOSIS — E559 Vitamin D deficiency, unspecified: Secondary | ICD-10-CM | POA: Diagnosis not present

## 2019-04-14 DIAGNOSIS — Z131 Encounter for screening for diabetes mellitus: Secondary | ICD-10-CM | POA: Diagnosis not present

## 2019-04-14 DIAGNOSIS — N390 Urinary tract infection, site not specified: Secondary | ICD-10-CM | POA: Diagnosis not present

## 2019-04-14 DIAGNOSIS — B1920 Unspecified viral hepatitis C without hepatic coma: Secondary | ICD-10-CM | POA: Diagnosis not present

## 2019-05-21 DIAGNOSIS — B1920 Unspecified viral hepatitis C without hepatic coma: Secondary | ICD-10-CM | POA: Diagnosis not present

## 2019-05-21 DIAGNOSIS — R195 Other fecal abnormalities: Secondary | ICD-10-CM | POA: Diagnosis not present

## 2019-06-01 DIAGNOSIS — Z6841 Body Mass Index (BMI) 40.0 and over, adult: Secondary | ICD-10-CM | POA: Diagnosis not present

## 2019-06-01 DIAGNOSIS — J449 Chronic obstructive pulmonary disease, unspecified: Secondary | ICD-10-CM | POA: Diagnosis not present

## 2019-06-01 DIAGNOSIS — F17210 Nicotine dependence, cigarettes, uncomplicated: Secondary | ICD-10-CM | POA: Diagnosis not present

## 2019-06-01 DIAGNOSIS — B1920 Unspecified viral hepatitis C without hepatic coma: Secondary | ICD-10-CM | POA: Diagnosis not present

## 2019-06-01 DIAGNOSIS — Z7984 Long term (current) use of oral hypoglycemic drugs: Secondary | ICD-10-CM | POA: Diagnosis not present

## 2019-06-01 DIAGNOSIS — Z20828 Contact with and (suspected) exposure to other viral communicable diseases: Secondary | ICD-10-CM | POA: Diagnosis not present

## 2019-06-01 DIAGNOSIS — M1990 Unspecified osteoarthritis, unspecified site: Secondary | ICD-10-CM | POA: Diagnosis not present

## 2019-06-01 DIAGNOSIS — K648 Other hemorrhoids: Secondary | ICD-10-CM | POA: Diagnosis not present

## 2019-06-01 DIAGNOSIS — K635 Polyp of colon: Secondary | ICD-10-CM | POA: Diagnosis not present

## 2019-06-01 DIAGNOSIS — K219 Gastro-esophageal reflux disease without esophagitis: Secondary | ICD-10-CM | POA: Diagnosis not present

## 2019-06-01 DIAGNOSIS — E119 Type 2 diabetes mellitus without complications: Secondary | ICD-10-CM | POA: Diagnosis not present

## 2019-06-04 DIAGNOSIS — K640 First degree hemorrhoids: Secondary | ICD-10-CM | POA: Diagnosis not present

## 2019-06-04 DIAGNOSIS — J449 Chronic obstructive pulmonary disease, unspecified: Secondary | ICD-10-CM | POA: Diagnosis not present

## 2019-06-04 DIAGNOSIS — B1920 Unspecified viral hepatitis C without hepatic coma: Secondary | ICD-10-CM | POA: Diagnosis not present

## 2019-06-04 DIAGNOSIS — B182 Chronic viral hepatitis C: Secondary | ICD-10-CM | POA: Diagnosis not present

## 2019-06-04 DIAGNOSIS — R195 Other fecal abnormalities: Secondary | ICD-10-CM | POA: Diagnosis not present

## 2019-06-04 DIAGNOSIS — K648 Other hemorrhoids: Secondary | ICD-10-CM | POA: Diagnosis not present

## 2019-06-04 DIAGNOSIS — Z1211 Encounter for screening for malignant neoplasm of colon: Secondary | ICD-10-CM | POA: Diagnosis not present

## 2019-06-04 DIAGNOSIS — D120 Benign neoplasm of cecum: Secondary | ICD-10-CM | POA: Diagnosis not present

## 2019-06-04 DIAGNOSIS — K635 Polyp of colon: Secondary | ICD-10-CM | POA: Diagnosis not present

## 2019-06-04 DIAGNOSIS — D124 Benign neoplasm of descending colon: Secondary | ICD-10-CM | POA: Diagnosis not present

## 2019-06-04 DIAGNOSIS — K219 Gastro-esophageal reflux disease without esophagitis: Secondary | ICD-10-CM | POA: Diagnosis not present

## 2019-06-04 DIAGNOSIS — E119 Type 2 diabetes mellitus without complications: Secondary | ICD-10-CM | POA: Diagnosis not present

## 2019-06-23 DIAGNOSIS — K635 Polyp of colon: Secondary | ICD-10-CM | POA: Diagnosis not present

## 2019-06-23 DIAGNOSIS — K648 Other hemorrhoids: Secondary | ICD-10-CM | POA: Diagnosis not present
